# Patient Record
Sex: Female | Born: 1960 | Race: Asian | Hispanic: No | Marital: Married | Smoking: Never smoker
Health system: Southern US, Community
[De-identification: ages and names within clinical notes are randomized; demographics above are authoritative.]

## PROBLEM LIST (undated history)

## (undated) DIAGNOSIS — I4891 Unspecified atrial fibrillation: Secondary | ICD-10-CM

## (undated) DIAGNOSIS — K649 Unspecified hemorrhoids: Secondary | ICD-10-CM

## (undated) DIAGNOSIS — H43819 Vitreous degeneration, unspecified eye: Secondary | ICD-10-CM

## (undated) DIAGNOSIS — A879 Viral meningitis, unspecified: Secondary | ICD-10-CM

## (undated) DIAGNOSIS — K921 Melena: Secondary | ICD-10-CM

## (undated) DIAGNOSIS — R55 Syncope and collapse: Secondary | ICD-10-CM

## (undated) DIAGNOSIS — J309 Allergic rhinitis, unspecified: Secondary | ICD-10-CM

## (undated) DIAGNOSIS — I499 Cardiac arrhythmia, unspecified: Secondary | ICD-10-CM

## (undated) DIAGNOSIS — D259 Leiomyoma of uterus, unspecified: Secondary | ICD-10-CM

## (undated) DIAGNOSIS — B009 Herpesviral infection, unspecified: Secondary | ICD-10-CM

## (undated) DIAGNOSIS — F909 Attention-deficit hyperactivity disorder, unspecified type: Secondary | ICD-10-CM

## (undated) DIAGNOSIS — K625 Hemorrhage of anus and rectum: Secondary | ICD-10-CM

## (undated) DIAGNOSIS — R002 Palpitations: Secondary | ICD-10-CM

## (undated) DIAGNOSIS — N920 Excessive and frequent menstruation with regular cycle: Secondary | ICD-10-CM

## (undated) HISTORY — DX: Cardiac arrhythmia, unspecified: I49.9

## (undated) HISTORY — DX: Melena: K92.1

## (undated) HISTORY — DX: Leiomyoma of uterus, unspecified: D25.9

## (undated) HISTORY — DX: Unspecified hemorrhoids: K64.9

## (undated) HISTORY — DX: Palpitations: R00.2

## (undated) HISTORY — DX: Allergic rhinitis, unspecified: J30.9

## (undated) HISTORY — DX: Attention-deficit hyperactivity disorder, unspecified type: F90.9

## (undated) HISTORY — PX: COLONOSCOPY: SHX174

## (undated) HISTORY — DX: Excessive and frequent menstruation with regular cycle: N92.0

## (undated) HISTORY — DX: Viral meningitis, unspecified: A87.9

## (undated) HISTORY — DX: Hemorrhage of anus and rectum: K62.5

## (undated) HISTORY — DX: Vitreous degeneration, unspecified eye: H43.819

## (undated) HISTORY — DX: Herpesviral infection, unspecified: B00.9

---

## 2003-07-26 HISTORY — PX: TOTAL ABDOMINAL HYSTERECTOMY: SHX209

## 2004-08-12 ENCOUNTER — Other Ambulatory Visit: Admission: RE | Admit: 2004-08-12 | Discharge: 2004-08-12 | Payer: Self-pay | Admitting: Obstetrics and Gynecology

## 2004-10-07 ENCOUNTER — Encounter: Admission: RE | Admit: 2004-10-07 | Discharge: 2004-10-07 | Payer: Self-pay | Admitting: Obstetrics and Gynecology

## 2004-10-22 ENCOUNTER — Encounter: Admission: RE | Admit: 2004-10-22 | Discharge: 2004-10-22 | Payer: Self-pay | Admitting: Obstetrics and Gynecology

## 2005-06-03 ENCOUNTER — Emergency Department (HOSPITAL_COMMUNITY): Admission: EM | Admit: 2005-06-03 | Discharge: 2005-06-04 | Payer: Self-pay | Admitting: Emergency Medicine

## 2006-05-03 ENCOUNTER — Other Ambulatory Visit: Admission: RE | Admit: 2006-05-03 | Discharge: 2006-05-03 | Payer: Self-pay | Admitting: Internal Medicine

## 2006-11-16 IMAGING — CR DG ANKLE COMPLETE 3+V*R*
3 series · 3 of 3 positions shown · non-contrast
Comparison: none

CLINICAL DATA: Fell.
RIGHT ANKLE - 3 VIEW:

[view not recorded (1 of 3)]
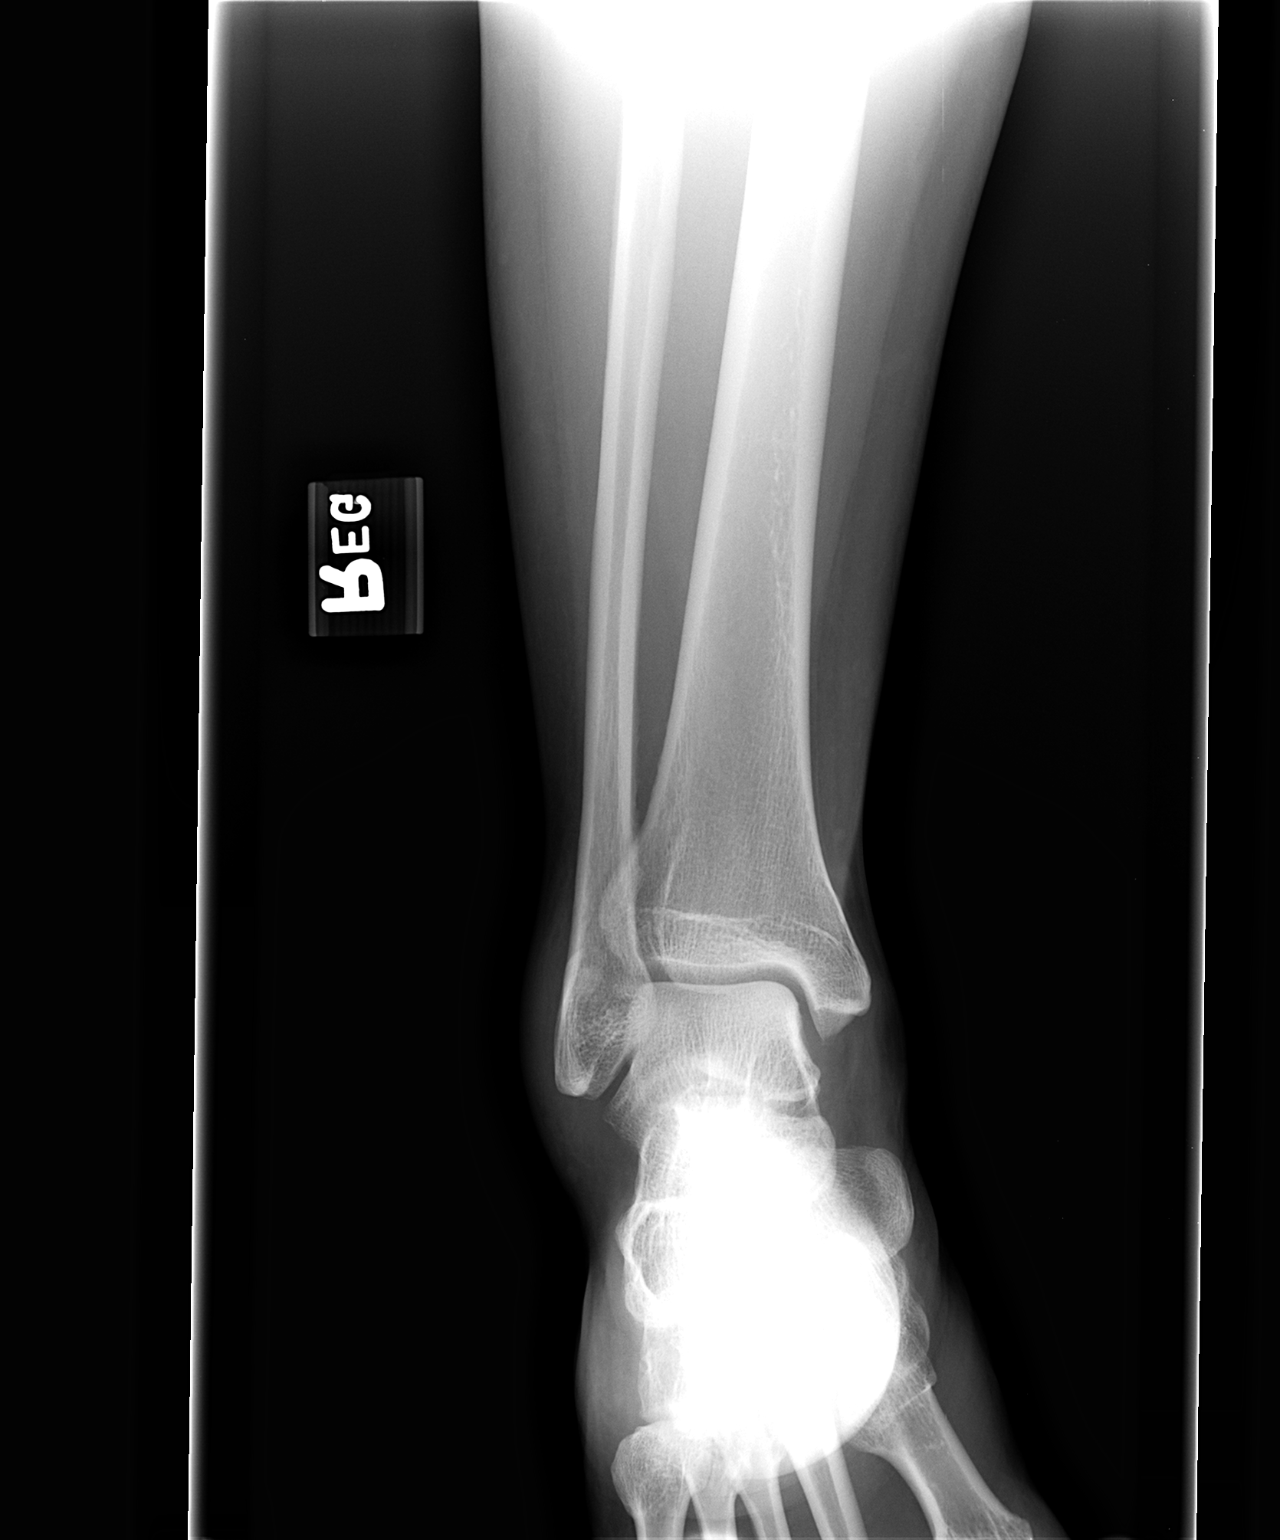

[view not recorded (2 of 3)]
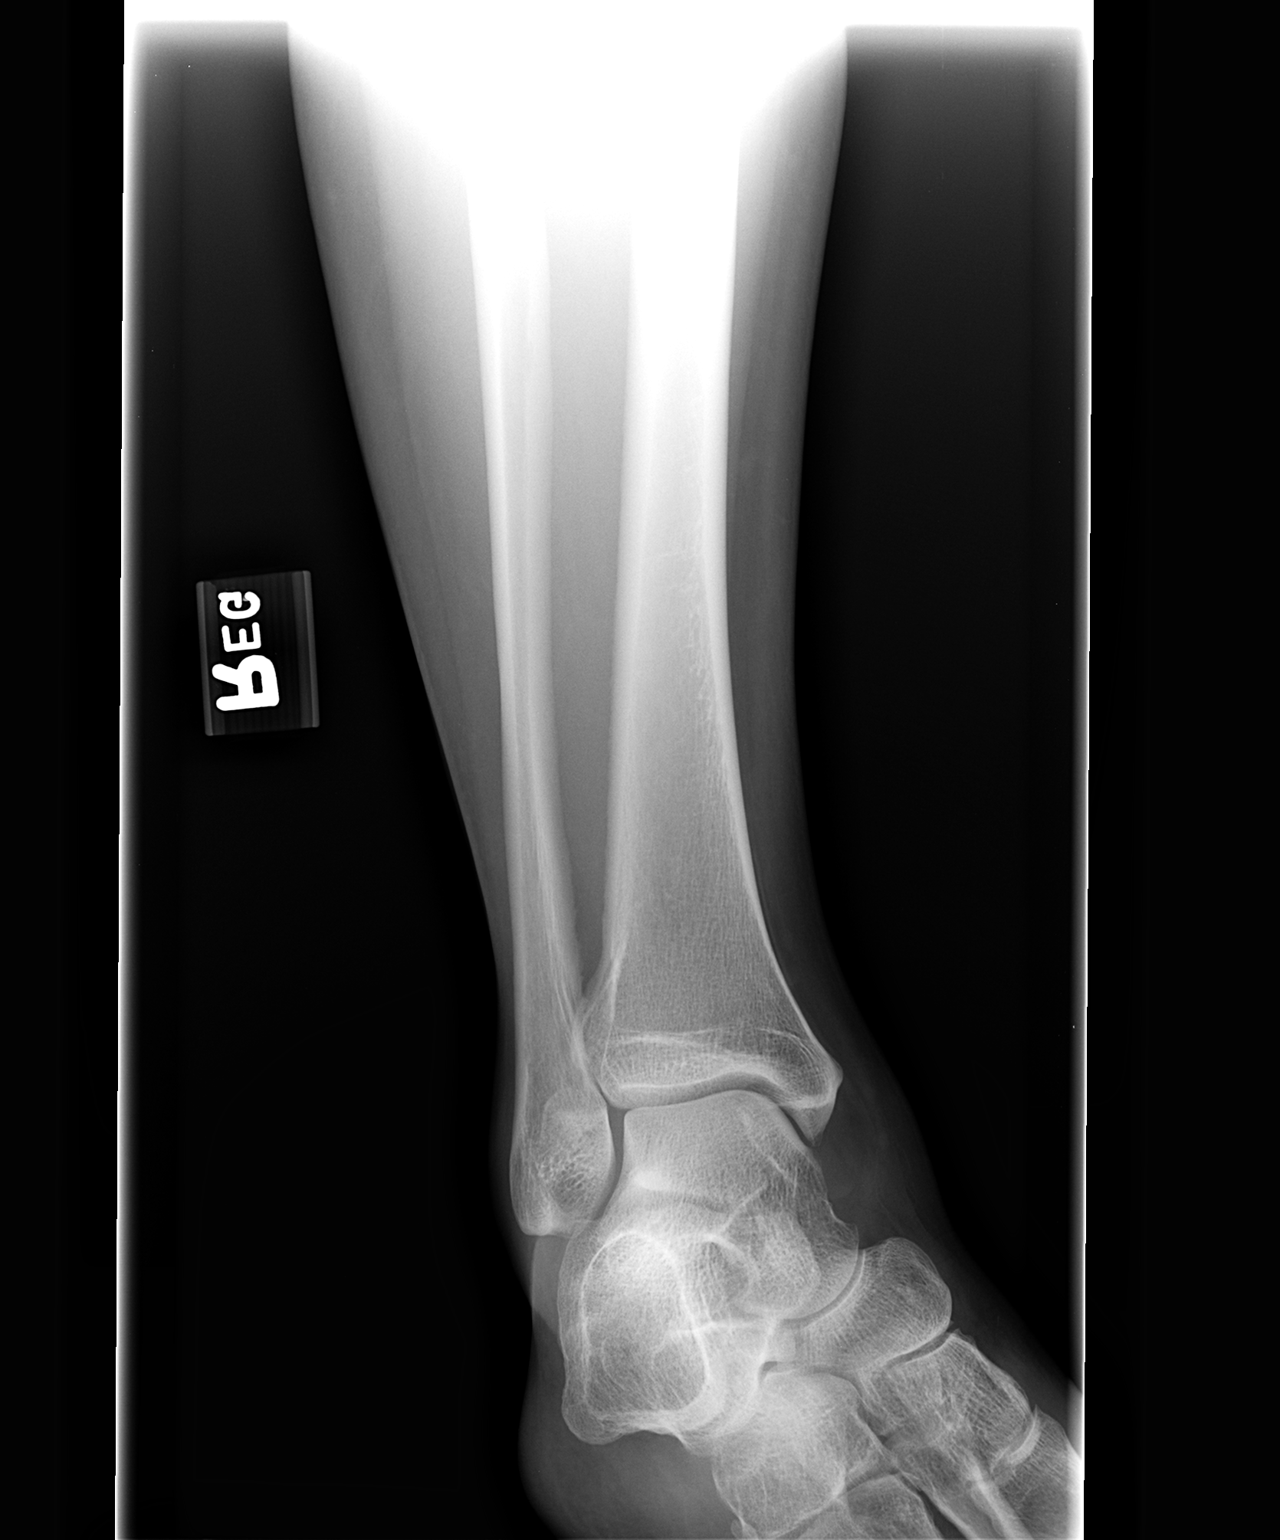

[view not recorded (3 of 3)]
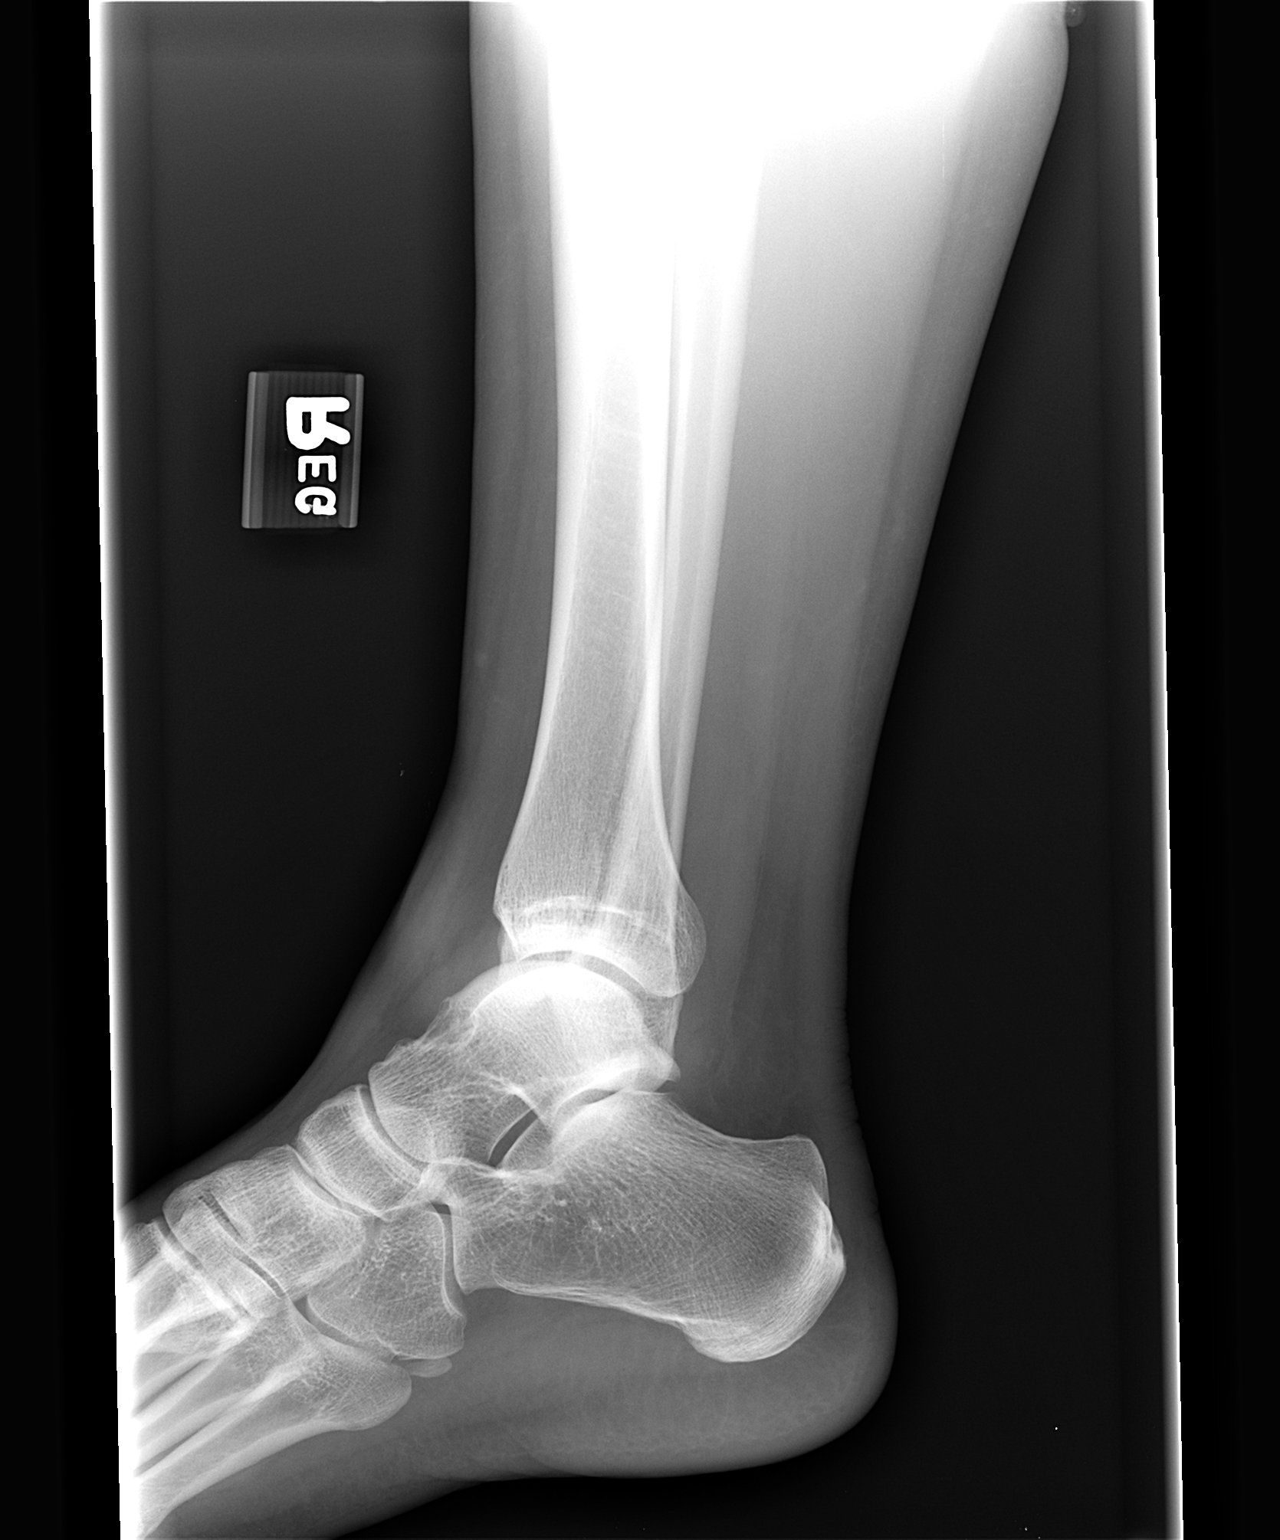

[3 of 3 positions shown; findings below may reference images not displayed]

FINDINGS: Soft tissue swelling laterally.  No fracture or dislocation.  No radiopaque foreign body detected.
IMPRESSION: Soft tissue swelling without underlying fracture.

## 2007-05-10 ENCOUNTER — Other Ambulatory Visit: Admission: RE | Admit: 2007-05-10 | Discharge: 2007-05-10 | Payer: Self-pay | Admitting: *Deleted

## 2007-05-22 ENCOUNTER — Other Ambulatory Visit: Admission: RE | Admit: 2007-05-22 | Discharge: 2007-05-22 | Payer: Self-pay | Admitting: *Deleted

## 2011-06-02 ENCOUNTER — Other Ambulatory Visit (HOSPITAL_COMMUNITY)
Admission: RE | Admit: 2011-06-02 | Discharge: 2011-06-02 | Disposition: A | Discharge status: Home / Self Care | Payer: BC Managed Care – PPO | Source: Ambulatory Visit | Attending: Family Medicine | Admitting: Family Medicine

## 2011-06-02 ENCOUNTER — Other Ambulatory Visit: Payer: Self-pay

## 2011-06-02 DIAGNOSIS — Z01419 Encounter for gynecological examination (general) (routine) without abnormal findings: Secondary | ICD-10-CM | POA: Insufficient documentation

## 2013-05-08 ENCOUNTER — Emergency Department (HOSPITAL_COMMUNITY)
Admission: EM | Admit: 2013-05-08 | Discharge: 2013-05-08 | Disposition: A | Discharge status: Home / Self Care | Payer: BC Managed Care – PPO | Attending: Emergency Medicine | Admitting: Emergency Medicine

## 2013-05-08 ENCOUNTER — Encounter (HOSPITAL_COMMUNITY): Payer: Self-pay | Admitting: Emergency Medicine

## 2013-05-08 DIAGNOSIS — Z79899 Other long term (current) drug therapy: Secondary | ICD-10-CM | POA: Insufficient documentation

## 2013-05-08 DIAGNOSIS — Z880 Allergy status to penicillin: Secondary | ICD-10-CM | POA: Insufficient documentation

## 2013-05-08 DIAGNOSIS — F411 Generalized anxiety disorder: Secondary | ICD-10-CM | POA: Insufficient documentation

## 2013-05-08 DIAGNOSIS — R002 Palpitations: Secondary | ICD-10-CM | POA: Insufficient documentation

## 2013-05-08 HISTORY — DX: Syncope and collapse: R55

## 2013-05-08 LAB — CBC WITH DIFFERENTIAL/PLATELET
Basophils Absolute: 0 10*3/uL (ref 0.0–0.1)
Basophils Relative: 0 % (ref 0–1)
Eosinophils Absolute: 0.1 10*3/uL (ref 0.0–0.7)
Eosinophils Relative: 1 % (ref 0–5)
HCT: 39.6 % (ref 36.0–46.0)
Hemoglobin: 13.7 g/dL (ref 12.0–15.0)
Lymphocytes Relative: 32 % (ref 12–46)
Lymphs Abs: 2.6 10*3/uL (ref 0.7–4.0)
MCH: 29.5 pg (ref 26.0–34.0)
MCHC: 34.6 g/dL (ref 30.0–36.0)
MCV: 85.2 fL (ref 78.0–100.0)
Monocytes Absolute: 0.5 10*3/uL (ref 0.1–1.0)
Monocytes Relative: 6 % (ref 3–12)
Neutro Abs: 4.9 10*3/uL (ref 1.7–7.7)
Neutrophils Relative %: 61 % (ref 43–77)
Platelets: 211 10*3/uL (ref 150–400)
RBC: 4.65 MIL/uL (ref 3.87–5.11)
RDW: 13.7 % (ref 11.5–15.5)
WBC: 8.1 10*3/uL (ref 4.0–10.5)

## 2013-05-08 LAB — BASIC METABOLIC PANEL
BUN: 14 mg/dL (ref 6–23)
CO2: 20 mEq/L (ref 19–32)
Calcium: 8.8 mg/dL (ref 8.4–10.5)
Chloride: 105 mEq/L (ref 96–112)
Creatinine, Ser: 0.62 mg/dL (ref 0.50–1.10)
GFR calc Af Amer: >90 mL/min (ref >90)
GFR calc non Af Amer: >90 mL/min (ref >90)
Glucose, Bld: 97 mg/dL (ref 70–99)
Potassium: 4 mEq/L (ref 3.5–5.1)
Sodium: 137 mEq/L (ref 135–145)

## 2013-05-08 LAB — POCT I-STAT TROPONIN I: Troponin i, poc: 0 ng/mL (ref 0.00–0.08)

## 2013-05-08 NOTE — ED Notes (Signed)
Pt developed left central CP around 2pm this afternoon. Went to her PCP and office called EMS to bring to the ER for further evaluation. Pt received 2 nitro tabs SL and brought chest discomfort from a 3 to 0.5.

## 2013-05-09 NOTE — ED Provider Notes (Signed)
CSN: [id number]     Arrival date & time 05/08/13  1745 History   First MD Initiated Contact with Patient 05/08/13 1816     Chief Complaint  Patient presents with  . Chest Pain    HPI  Patient presents with a several year history of a feeling of "a funny feeling in my chest". She describes these as a feeling of a flutter o'clock in her chest followed by a feeling of rush in her neck. She'll fill his a few fast heartbeats for a few seconds afterwards no longer than 5-10 seconds. She felt a little discomfort today and was seen by her physician referred here.  One or 2 cups of caffeine per day no other stimulants with cough and cold medicine. No history hypertension diabetes high cholesterol or smoking. No family history of heart disease.  Past Medical History  Diagnosis Date  . Syncope    History reviewed. No pertinent past surgical history. No family history on file. History  Substance Use Topics  . Smoking status: Not on file  . Smokeless tobacco: Not on file  . Alcohol Use: Not on file   OB History   Grav Para Term Preterm Abortions TAB SAB Ect Mult Living                 Review of Systems  Constitutional: Negative for fever, chills, diaphoresis, appetite change and fatigue.  HENT: Negative for mouth sores, sore throat and trouble swallowing.   Eyes: Negative for visual disturbance.  Respiratory: Negative for cough, chest tightness, shortness of breath and wheezing.   Cardiovascular: Negative for chest pain.  Gastrointestinal: Negative for nausea, vomiting, abdominal pain, diarrhea and abdominal distention.  Endocrine: Negative for polydipsia, polyphagia and polyuria.  Genitourinary: Negative for dysuria, frequency and hematuria.  Musculoskeletal: Negative for gait problem.  Skin: Negative for color change, pallor and rash.  Neurological: Negative for dizziness, syncope, light-headedness and headaches.  Hematological: Does not bruise/bleed easily.  Psychiatric/Behavioral:  Negative for behavioral problems and confusion.       Anxious speaks with pressured speech that dominates a conversation    Allergies  Apple and Penicillins  Home Medications   Current Outpatient Rx  Name  Route  Sig  Dispense  Refill  . aspirin 81 MG tablet   Oral   Take 81 mg by mouth daily as needed for pain.         . Cholecalciferol (VITAMIN D-3) 1000 UNITS CAPS   Oral   Take 1,000 Units by mouth daily.          BP 103/80  Pulse 65  Temp(Src) 98 F (36.7 C) (Oral)  Resp 19  Ht 5\' 5"  (1.651 m)  Wt 135 lb (61.236 kg)  BMI 22.47 kg/m2  SpO2 99% Physical Exam  Constitutional: She is oriented to person, place, and time. She appears well-developed and well-nourished. No distress.  HENT:  Head: Normocephalic.  Eyes: Conjunctivae are normal. Pupils are equal, round, and reactive to light. No scleral icterus.  Neck: Normal range of motion. Neck supple. No thyromegaly present.  Cardiovascular: Normal rate and regular rhythm.  Exam reveals no gallop and no friction rub.   No murmur heard. Sinus rhythm on the monitor no ectopy while in the department. No episodes of tachycardia  Pulmonary/Chest: Effort normal and breath sounds normal. No respiratory distress. She has no wheezes. She has no rales.  Abdominal: Soft. Bowel sounds are normal. She exhibits no distension. There is no tenderness. There is no  rebound.  Musculoskeletal: Normal range of motion.  Neurological: She is alert and oriented to person, place, and time.  Skin: Skin is warm and dry. No rash noted.  Psychiatric: She has a normal mood and affect. Her behavior is normal.    ED Course  Procedures (including critical care time) Labs Review Labs Reviewed  CBC WITH DIFFERENTIAL  BASIC METABOLIC PANEL  TSH  POCT I-STAT TROPONIN I   Imaging Review No results found.  EKG Interpretation   None       MDM   1. Palpitations    Her description of these events L. quite classically like PVCs. She  describes a feeling of movement in her chest sometimes a "rush" to her neck and then 5-10 seconds of tachycardia palpitations. These are isolated. She does not describe any ongoing symptoms or salvos of palpitations. She is otherwise asymptomatic with them i.e. no syncope presyncope or other symptoms.  She had a vague description of discomfort today. She has a normal troponin after several hours of this discomfort. She is normal EKG. She has normal electrolytes. TSH is pending. I have referred her to cardiology for request of Holter or event monitoring. ER if acute changes    Roney Marion, MD 05/09/13 [phone]

## 2013-05-30 ENCOUNTER — Encounter: Payer: Self-pay | Admitting: Cardiology

## 2013-06-01 ENCOUNTER — Encounter: Payer: Self-pay | Admitting: Cardiology

## 2013-06-03 ENCOUNTER — Encounter: Payer: Self-pay | Admitting: Cardiology

## 2013-06-03 ENCOUNTER — Ambulatory Visit (INDEPENDENT_AMBULATORY_CARE_PROVIDER_SITE_OTHER): Payer: BC Managed Care – PPO | Admitting: Cardiology

## 2013-06-03 ENCOUNTER — Ambulatory Visit: Payer: BC Managed Care – PPO | Admitting: Cardiology

## 2013-06-03 VITALS — BP 114/81 | HR 73 | Ht 65.0 in | Wt 136.0 lb

## 2013-06-03 DIAGNOSIS — R002 Palpitations: Secondary | ICD-10-CM | POA: Insufficient documentation

## 2013-06-03 NOTE — Progress Notes (Signed)
[address] [place], [place]  [address] Phone: [phone] Fax:  [phone]  Date:  06/03/2013   ID:  Shelly Mahoney, DOB 01/15/1961, MRN [id number]  PCP:  Cain Saupe, MD  Cardiologist:  Armanda Magic, MD     History of Present Illness: Shelly Mahoney is a 52 y.o. female with a history of uterine fibroids and severe menorrhagi about 10 years ago and was told that she had a heart murmur from her anemia.  She had a hysterectomy which resolved her anemia.  She has noticed over the past 10 years that she has occasoinal palpitations that she notices only when sitting down or lying in bed.  She describes it as a fluttering sensation that lasts a few seconds and resolves.  She says that last April she got very nauseated and had been up for 2 days with a sick child and was walking down the stairs and got nauseated and dizzy and the room got dark and she passed out.  She broke 2 ribs.  In October she went the to [facility] clinic and was told her EKG was mildly abnormal and was sent to the ER for heart fluttering and a fullness in her ear.  She was taken by EMS and was given NTG but had no CP.  Since then she continues to notice occasional heart flutters for 2-3 minutes.  She has never passed out in the setting of palpitations.  She denies any chest pain, SOB or DOE.   Wt Readings from Last 3 Encounters:  06/03/13 136 lb (61.689 kg)  05/08/13 135 lb (61.236 kg)     Past Medical History  Diagnosis Date  . Syncope   . Arrhythmia   . Palpitations   . Fibroid, uterine   . Allergic rhinitis   . Viral meningitis   . Posterior vitreous detachment   . Menorrhagia     Current Outpatient Prescriptions  Medication Sig Dispense Refill  . aspirin 81 MG tablet Take 81 mg by mouth daily as needed for pain.      . Cholecalciferol (VITAMIN D-3) 1000 UNITS CAPS Take 1,000 Units by mouth daily.       No current facility-administered medications for this visit.    Allergies:    Allergies    Allergen Reactions  . Apple Nausea Only    Red skin apples Reaction: headaches, nausea  . Cephalosporins   . Penicillins Hives and Rash    Social History:  The patient  reports that she has never smoked. She does not have any smokeless tobacco history on file. She reports that she does not drink alcohol or use illicit drugs.   Family History:  The patient's family history includes Heart attack in her father; Heart disease in her father; Hypertension in her mother.   ROS:  Please see the history of present illness.      All other systems reviewed and negative.   PHYSICAL EXAM: VS:  BP 114/81  Pulse 73  Ht 5\' 5"  (1.651 m)  Wt 136 lb (61.689 kg)  BMI 22.63 kg/m2 Well nourished, well developed, in no acute distress HEENT: normal Neck: no JVD Cardiac:  normal S1, S2; RRR; no murmur Lungs:  clear to auscultation bilaterally, no wheezing, rhonchi or rales Abd: soft, nontender, no hepatomegaly Ext: no edema Skin: warm and dry Neuro:  CNs 2-12 intact, no focal abnormalities noted     EKG:  NSR  ASSESSMENT AND PLAN:  1. Palpitations  - I  will order an Event Monitor  Followup with me in 4 weeks  Signed, Armanda Magic, MD 06/03/2013 8:41 AM

## 2013-06-03 NOTE — Patient Instructions (Addendum)
Your physician recommends that you continue on your current medications as directed. Please refer to the Current Medication list given to you today.  Your physician has recommended that you wear an event monitor. Event monitors are medical devices that record the heart's electrical activity. Doctors most often us these monitors to diagnose arrhythmias. Arrhythmias are problems with the speed or rhythm of the heartbeat. The monitor is a small, portable device. You can wear one while you do your normal daily activities. This is usually used to diagnose what is causing palpitations/syncope (passing out).   Your physician recommends that you schedule a follow-up appointment in: 4 weeks with Dr. Turner  

## 2013-06-06 ENCOUNTER — Encounter: Payer: Self-pay | Admitting: *Deleted

## 2013-06-06 ENCOUNTER — Encounter (INDEPENDENT_AMBULATORY_CARE_PROVIDER_SITE_OTHER): Payer: BC Managed Care – PPO

## 2013-06-06 DIAGNOSIS — R55 Syncope and collapse: Secondary | ICD-10-CM

## 2013-06-06 DIAGNOSIS — R002 Palpitations: Secondary | ICD-10-CM

## 2013-06-06 NOTE — Progress Notes (Signed)
Patient ID: Shelly Mahoney, female   DOB: [birth year], 52 y.o.   MRN: [id number] [employer] 30 day cardiac event monitor applied to patient.

## 2013-07-10 ENCOUNTER — Ambulatory Visit: Payer: BC Managed Care – PPO | Admitting: Cardiology

## 2013-07-16 ENCOUNTER — Encounter: Payer: Self-pay | Admitting: Cardiology

## 2013-07-16 ENCOUNTER — Ambulatory Visit (INDEPENDENT_AMBULATORY_CARE_PROVIDER_SITE_OTHER): Payer: BC Managed Care – PPO | Admitting: Cardiology

## 2013-07-16 VITALS — BP 125/78 | HR 72 | Ht 65.0 in | Wt 136.0 lb

## 2013-07-16 DIAGNOSIS — R002 Palpitations: Secondary | ICD-10-CM

## 2013-07-16 LAB — MAGNESIUM: Magnesium: 1.9 mg/dL (ref 1.5–2.5)

## 2013-07-16 NOTE — Patient Instructions (Signed)
Your physician recommends that you continue on your current medications as directed. Please refer to the Current Medication list given to you today.  Your physician recommends that you go to the lab today for a Magnesium Panel  Your physician recommends that you schedule a follow-up appointment in: as needed

## 2013-07-16 NOTE — Progress Notes (Signed)
[address] [place], [place]  [address] Phone: [phone] Fax:  [phone]  Date:  07/16/2013   ID:  Shelly Mahoney, DOB [birth year], MRN [id number]  PCP:  Cain Saupe, MD  Cardiologist:  Armanda Magic, MD     History of Present Illness: Shelly Mahoney is a 52 y.o. female with a history of uterine fibroids and severe menorrhagia about 10 years ago and was told that she had a heart murmur from her anemia. She had a hysterectomy which resolved her anemia. She noticed over the past 10 years that she has occasoinal palpitations that she notices only when sitting down or lying in bed. She describes it as a fluttering sensation that lasts a few seconds and resolves. She says that last April she got very nauseated and had been up for 2 days with a sick child and was walking down the stairs and got nauseated and dizzy and the room got dark and she passed out. She broke 2 ribs. In October she went the to [facility] clinic and was told her EKG was mildly abnormal and was sent to the ER for heart fluttering and a fullness in her ear. She was taken by EMS and was given NTG but had no CP. Since then she continues to notice occasional heart flutters for 2-3 minutes. She has never passed out in the setting of palpitations. She denies any chest pain, SOB or DOE.       Wt Readings from Last 3 Encounters:  06/03/13 136 lb (61.689 kg)  05/08/13 135 lb (61.236 kg)     Past Medical History  Diagnosis Date  . Syncope   . Arrhythmia   . Palpitations   . Fibroid, uterine   . Allergic rhinitis   . Viral meningitis   . Posterior vitreous detachment   . Menorrhagia     Current Outpatient Prescriptions  Medication Sig Dispense Refill  . aspirin 81 MG tablet Take 81 mg by mouth daily as needed for pain.      . Cholecalciferol (VITAMIN D-3) 1000 UNITS CAPS Take 2,000 Units by mouth daily.        No current facility-administered medications for this visit.    Allergies:    Allergies    Allergen Reactions  . Apple Nausea Only    Red skin apples Reaction: headaches, nausea  . Cephalosporins   . Penicillins Hives and Rash    Social History:  The patient  reports that she has never smoked. She does not have any smokeless tobacco history on file. She reports that she does not drink alcohol or use illicit drugs.   Family History:  The patient's family history includes Heart attack in her father; Heart disease in her father; Hypertension in her mother.   ROS:  Please see the history of present illness.      All other systems reviewed and negative.   PHYSICAL EXAM: VS:  There were no vitals taken for this visit. Well nourished, well developed, in no acute distress HEENT: normal Neck: no JVD Cardiac:  normal S1, S2; RRR; no murmur Lungs:  clear to auscultation bilaterally, no wheezing, rhonchi or rales Abd: soft, nontender, no hepatomegaly Ext: no edema Skin: warm and dry Neuro:  CNs 2-12 intact, no focal abnormalities noted  ASSESSMENT AND PLAN:  1. Palpitations - I have reassured her that her heart monitor showed PAC's which are benign.  She had a full panel of labs recently which showed normal TSH and potassium.  She is concerned that her magnesium level may be low so we will check a magnesium level.  I encouraged her to cut back on her caffeine intake.  Followup with me on a PRN basis    Signed, Armanda Magic, MD 07/16/2013 10:42 AM

## 2017-06-19 ENCOUNTER — Telehealth: Payer: Self-pay

## 2017-06-19 NOTE — Telephone Encounter (Signed)
Sent notes to scheduling 

## 2017-10-31 ENCOUNTER — Encounter: Payer: Self-pay | Admitting: Interventional Cardiology

## 2017-11-01 ENCOUNTER — Ambulatory Visit: Payer: BC Managed Care – PPO | Admitting: Interventional Cardiology

## 2017-11-01 ENCOUNTER — Encounter: Payer: Self-pay | Admitting: Interventional Cardiology

## 2017-11-01 VITALS — BP 112/72 | HR 62 | Ht 65.0 in | Wt 134.4 lb

## 2017-11-01 DIAGNOSIS — R002 Palpitations: Secondary | ICD-10-CM

## 2017-11-01 NOTE — Progress Notes (Signed)
Cardiology Office Note   Date:  11/01/2017   ID:  Shelly Mahoney, DOB 04-10-1961, MRN [id number]  PCP:  Lujean Amel, MD   Primary cardiologist: Dr. Fransico Him    No chief complaint on file.  palpitations  Wt Readings from Last 3 Encounters:  07/16/13 136 lb (61.7 kg)  06/03/13 136 lb (61.7 kg)  05/08/13 135 lb (61.2 kg)       History of Present Illness: Shelly Mahoney is a 57 y.o. female  Who has had palpitations in the past.  She had a Holter monitor in 2014 and this was benign per her report.    Records reviewed showed: "history of uterine fibroids and severe menorrhagia about 10 years ago and was told that she had a heart murmur from her anemia. She had a hysterectomy which resolved her anemia.."  She had a syncopal episode in 2014.  She got busy and did not follow up.  She is here today for f/u from 2014.  She does not exercise regularly at this time.    Denies : Chest pain. Dizziness. Leg edema. Nitroglycerin use. Orthopnea. Palpitations. Paroxysmal nocturnal dyspnea. Shortness of breath. Syncope.      Past Medical History:  Diagnosis Date  . 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 Allergic rhinitis   . Arrhythmia   . Blood in stool   . Fibroid, uterine   . Hemorrhoids   . Herpes   . Menorrhagia   . Palpitations   . Posterior vitreous detachment   . Rectal bleeding   . Syncope   . Viral meningitis     Past Surgical History:  Procedure Laterality Date  . CESAREAN SECTION  1991  . COLONOSCOPY    . TOTAL ABDOMINAL HYSTERECTOMY  2005     Current Outpatient Medications  Medication Sig Dispense Refill  . aspirin 81 MG tablet Take 81 mg by mouth daily as needed for pain.    . Cholecalciferol (VITAMIN D-3) 1000 UNITS CAPS Take 2,000 Units by mouth daily.      No current facility-administered medications for this visit.     Allergies:   Apple; Cephalosporins; Other; and Penicillins    Social History:  The patient  reports that she has  never smoked. She has never used smokeless tobacco. She reports that she drinks alcohol. She reports that she does not use drugs.   Family History:  The patient's family history includes Arthritis in her mother; Colon polyps (age of onset: 24) in her father; Hypertension in her mother; Osteoporosis in her mother; Other in her father and mother; Stroke in her mother.    ROS:  Please see the history of present illness.   Otherwise, review of systems are positive for occasional flutters in her chest- less pronounced recently.   All other systems are reviewed and negative.    PHYSICAL EXAM: VS:  Ht 5\' 5"  (1.651 m)   BMI 22.63 kg/m  , BMI Body mass index is 22.63 kg/m. GEN: Well nourished, well developed, in no acute distress  HEENT: normal  Neck: no JVD, carotid bruits, or masses Cardiac: RRR; no murmurs, rubs, or gallops,no edema  Respiratory:  clear to auscultation bilaterally, normal work of breathing GI: soft, nontender, nondistended, + BS MS: no deformity or atrophy  Skin: warm and dry, no rash Neuro:  Strength and sensation are intact Psych: euthymic mood, full affect   EKG:   The ekg ordered today demonstrates normal ECG   Recent Labs:  No results found for requested labs within last 8760 hours.   Lipid Panel No results found for: CHOL, TRIG, HDL, CHOLHDL, VLDL, LDLCALC, LDLDIRECT   Other studies Reviewed: Additional studies/ records that were reviewed today with results demonstrating: 2017 normal ECG.   ASSESSMENT AND PLAN:  1. Palpitations: Symptoms are less pronounced at this time.  Continue current management.  She still has occasional flutters that sound like isolated premature beats.  No lightheadedness or syncope. 2. She would benefit from regular exercise.  I encouraged her to walk to achieve the exercise level noted below.  I also encouraged a healthy diet.   Current medicines are reviewed at length with the patient today.  The patient concerns regarding her  medicines were addressed.  The following changes have been made:  No change  Labs/ tests ordered today include:  No orders of the defined types were placed in this encounter.   Recommend 150 minutes/week of aerobic exercise Low fat, low carb, high fiber diet recommended  Disposition:   FU as needed with Dr. Radford Pax   Signed, Shelly Grooms, MD  11/01/2017 11:26 AM    [address], [place], [place]  [address] Phone: [phone]; Fax: [phone]

## 2017-11-01 NOTE — Patient Instructions (Signed)
Medication Instructions:  Your physician recommends that you continue on your current medications as directed. Please refer to the Current Medication list given to you today.   Labwork: None ordered  Testing/Procedures: None ordered  Follow-Up: Your physician wants you to follow-up with Dr. Varanasi AS NEEDED   Any Other Special Instructions Will Be Listed Below (If Applicable).     If you need a refill on your cardiac medications before your next appointment, please call your pharmacy.   

## 2018-05-07 NOTE — Progress Notes (Signed)
Cardiology Office Note   Date:  05/08/2018   ID:  Shelly Mahoney, DOB June 25, 1961, MRN [id number]  PCP:  Lujean Amel, MD    No chief complaint on file.  palpitations  Wt Readings from Last 3 Encounters:  05/08/18 129 lb 9.6 oz (58.8 kg)  11/01/17 134 lb 6.4 oz (61 kg)  07/16/13 136 lb (61.7 kg)       History of Present Illness: Shelly Mahoney is a 57 y.o. female  Who has had palpitations in the past.  She had a Holter monitor in 2014 and this was benign per her report.    Records reviewed showed: "history of uterine fibroids and severe menorrhagia about 10 years ago and was told that she had a heart murmur from her anemia. She had a hysterectomy which resolved her anemia.."  She had a syncopal episode in 2014.  Seen in 2019.  Had sx of isolated premature beats.  Exercise was also recommended.   Since the last visit, she had done well until recently.  She is felt chest pain yesterday- it was sharp and occurred at rest under the left breast, between the ribs- resolved after a minute. No associated symptoms.   She has noted a headache  And stiff neck yesterday.  She feels some heart pounding- like her prior PACs..    No problems waling up stairs.  18 steps to go upstairs and no cardiac sx with multiple trips.   Past Medical History:  Diagnosis Date  . 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 Allergic rhinitis   . Arrhythmia   . Blood in stool   . Fibroid, uterine   . Hemorrhoids   . Herpes   . Menorrhagia   . Palpitations   . Posterior vitreous detachment   . Rectal bleeding   . Syncope   . Viral meningitis     Past Surgical History:  Procedure Laterality Date  . CESAREAN SECTION  1991  . COLONOSCOPY    . TOTAL ABDOMINAL HYSTERECTOMY  2005     Current Outpatient Medications  Medication Sig Dispense Refill  . aspirin 81 MG tablet Take 81 mg by mouth daily as needed for pain.    . Cholecalciferol (VITAMIN D-3) 1000 UNITS CAPS Take 1,000 Units by  mouth daily.      No current facility-administered medications for this visit.     Allergies:   Apple; Cephalosporins; Other; and Penicillins    Social History:  The patient  reports that she has never smoked. She has never used smokeless tobacco. She reports that she drinks alcohol. She reports that she does not use drugs.   Family History:  The patient's family history includes Arthritis in her mother; Colon polyps (age of onset: 76) in her father; Hypertension in her mother; Osteoporosis in her mother; Other in her father and mother; Stroke in her mother.    ROS:  Please see the history of present illness.   Otherwise, review of systems are positive for CP as above.   All other systems are reviewed and negative.    PHYSICAL EXAM: VS:  BP 100/78   Pulse 66   Ht 5\' 5"  (1.651 m)   Wt 129 lb 9.6 oz (58.8 kg)   SpO2 98%   BMI 21.57 kg/m  , BMI Body mass index is 21.57 kg/m. GEN: Well nourished, well developed, in no acute distress  HEENT: normal  Neck: no JVD, carotid bruits, or masses Cardiac: RRR; no murmurs,  rubs, or gallops,no edema  Respiratory:  clear to auscultation bilaterally, normal work of breathing GI: soft, nontender, nondistended, + BS MS: no deformity or atrophy  Skin: warm and dry, no rash Neuro:  Strength and sensation are intact Psych: euthymic mood, full affect   EKG:   The ekg ordered today demonstrates normal sinus rhythm   Recent Labs: No results found for requested labs within last 8760 hours.   Lipid Panel No results found for: CHOL, TRIG, HDL, CHOLHDL, VLDL, LDLCALC, LDLDIRECT   Other studies Reviewed: Additional studies/ records that were reviewed today with results demonstrating: monitor results noted. LDL < 130, HDL 68.   ASSESSMENT AND PLAN:  1. Chest pain: Very atypical.  Nonexertional.  We discussed further evaluation with imaging such as CT angiogram.  She is not interested at this time.  I think this is reasonable.  If her symptoms  come back, we can always reconsider.  Her episode was a an isolated, short episode that was atypical.  I certainly would encourage her to have further testing if she had any exertional symptoms.  Currently, she has no exertional discomfort. 2. PAC: She has some episodic premature beats.  None noted on exam today. 3. Headache/stiff neck: No fever or visual changes.  History of viral meningitis.  I do not think this is related to her heart.  She will monitor her symptoms and contact her primary care doctor if things get worse.   Current medicines are reviewed at length with the patient today.  The patient concerns regarding her medicines were addressed.  The following changes have been made:  No change  Labs/ tests ordered today include:  No orders of the defined types were placed in this encounter.   Recommend 150 minutes/week of aerobic exercise Low fat, low carb, high fiber diet recommended  Disposition:   FU as needed   Signed, Larae Grooms, MD  05/08/2018 2:55 PM    [address], [place], [place]  [address] Phone: [phone]; Fax: [phone]

## 2018-05-08 ENCOUNTER — Ambulatory Visit: Payer: BC Managed Care – PPO | Admitting: Interventional Cardiology

## 2018-05-08 ENCOUNTER — Encounter: Payer: Self-pay | Admitting: Interventional Cardiology

## 2018-05-08 VITALS — BP 100/78 | HR 66 | Ht 65.0 in | Wt 129.6 lb

## 2018-05-08 DIAGNOSIS — I491 Atrial premature depolarization: Secondary | ICD-10-CM | POA: Diagnosis not present

## 2018-05-08 DIAGNOSIS — R0782 Intercostal pain: Secondary | ICD-10-CM

## 2018-05-08 DIAGNOSIS — R51 Headache: Secondary | ICD-10-CM

## 2018-05-08 DIAGNOSIS — R519 Headache, unspecified: Secondary | ICD-10-CM

## 2018-05-08 NOTE — Patient Instructions (Signed)
Medication Instructions:  Your physician recommends that you continue on your current medications as directed. Please refer to the Current Medication list given to you today.  If you need a refill on your cardiac medications before your next appointment, please call your pharmacy.   Lab work: None Ordered  If you have labs (blood work) drawn today and your tests are completely normal, you will receive your results only by: . MyChart Message (if you have MyChart) OR . A paper copy in the mail If you have any lab test that is abnormal or we need to change your treatment, we will call you to review the results.  Testing/Procedures: None ordered  Follow-Up: . AS NEEDED  Any Other Special Instructions Will Be Listed Below (If Applicable).    

## 2019-10-17 ENCOUNTER — Ambulatory Visit: Payer: BC Managed Care – PPO

## 2019-10-19 ENCOUNTER — Ambulatory Visit: Payer: BC Managed Care – PPO | Attending: Internal Medicine

## 2019-10-19 DIAGNOSIS — Z23 Encounter for immunization: Secondary | ICD-10-CM

## 2019-10-19 NOTE — Progress Notes (Signed)
   Covid-19 Vaccination Clinic  Name:  Milca Mcerlean    MRN: [id number] DOB: [birth year]  10/19/2019  Ms. Strickler was observed post Covid-19 immunization for 30 minutes based on pre-vaccination screening without incident. She was provided with Vaccine Information Sheet and instruction to access the V-Safe system.   Ms. Burlingham was instructed to call 911 with any severe reactions post vaccine: Marland Kitchen Difficulty breathing  . Swelling of face and throat  . A fast heartbeat  . A bad rash all over body  . Dizziness and weakness   Immunizations Administered    Name Date Dose VIS Date Route   Pfizer COVID-19 Vaccine 10/19/2019  2:51 PM 0.3 mL 07/05/2019 Intramuscular   Manufacturer: [place]   Lot: [id number]   [place]: [id number]

## 2019-10-25 ENCOUNTER — Other Ambulatory Visit: Payer: Self-pay

## 2019-10-25 ENCOUNTER — Emergency Department (HOSPITAL_COMMUNITY)
Admission: EM | Admit: 2019-10-25 | Discharge: 2019-10-25 | Disposition: A | Discharge status: Home / Self Care | Payer: BC Managed Care – PPO | Attending: Emergency Medicine | Admitting: Emergency Medicine

## 2019-10-25 DIAGNOSIS — Z7982 Long term (current) use of aspirin: Secondary | ICD-10-CM | POA: Diagnosis not present

## 2019-10-25 DIAGNOSIS — R002 Palpitations: Secondary | ICD-10-CM | POA: Diagnosis present

## 2019-10-25 LAB — BASIC METABOLIC PANEL
Anion gap: 11 (ref 5–15)
BUN: 14 mg/dL (ref 6–20)
CO2: 23 mmol/L (ref 22–32)
Calcium: 8.7 mg/dL — ABNORMAL LOW (ref 8.9–10.3)
Chloride: 109 mmol/L (ref 98–111)
Creatinine, Ser: 0.8 mg/dL (ref 0.44–1.00)
GFR calc Af Amer: >60 mL/min (ref >60)
GFR calc non Af Amer: >60 mL/min (ref >60)
Glucose, Bld: 120 mg/dL — ABNORMAL HIGH (ref 70–99)
Potassium: 3.5 mmol/L (ref 3.5–5.1)
Sodium: 143 mmol/L (ref 135–145)

## 2019-10-25 LAB — CBC
HCT: 42.8 % (ref 36.0–46.0)
Hemoglobin: 13.9 g/dL (ref 12.0–15.0)
MCH: 28.8 pg (ref 26.0–34.0)
MCHC: 32.5 g/dL (ref 30.0–36.0)
MCV: 88.8 fL (ref 80.0–100.0)
Platelets: 255 10*3/uL (ref 150–400)
RBC: 4.82 MIL/uL (ref 3.87–5.11)
RDW: 13.9 % (ref 11.5–15.5)
WBC: 5.7 10*3/uL (ref 4.0–10.5)
nRBC: 0 % (ref 0.0–0.2)

## 2019-10-25 LAB — MAGNESIUM: Magnesium: 1.9 mg/dL (ref 1.7–2.4)

## 2019-10-25 LAB — TSH: TSH: 1.631 u[IU]/mL (ref 0.350–4.500)

## 2019-10-25 NOTE — ED Notes (Signed)
ED Provider at bedside. 

## 2019-10-25 NOTE — ED Triage Notes (Addendum)
Pt reports 3 hr PTA she began to feel heart "fluttering and then start pounding... a sensation of skipping, like a fluttering.  Activity made it worse"  Denies SHOB, denies CP.    Reports hx of PAC's. Pt reports taking 5 baby aspirin appx 1 hour PTA.    Pt reports receiving 1/2 COVID shot on 3/27.

## 2019-10-25 NOTE — Discharge Instructions (Signed)
Please read and follow all provided instructions.  Your diagnoses today include:  1. Palpitations     Tests performed today include:  An EKG of your heart - no problems  Blood counts and electrolytes  Thyroid test - came back and was normal  Vital signs. See below for your results today.   Medications prescribed:   None  Take any prescribed medications only as directed.  Follow-up instructions: Please follow-up with your cardiologist for recheck, especially if your symptoms are recurrent.  Return instructions:  SEEK IMMEDIATE MEDICAL ATTENTION IF:  You have severe chest pain, especially if the pain is crushing or pressure-like and spreads to the arms, back, neck, or jaw, or if you have sweating, nausea (feeling sick to your stomach), or shortness of breath. THIS IS AN EMERGENCY. Don't wait to see if the pain will go away. Get medical help at once. Call 911 or 0 (operator). DO NOT drive yourself to the hospital.   Your chest pain gets worse and does not go away with rest.   You have an attack of chest pain lasting longer than usual, despite rest and treatment with the medications your caregiver has prescribed.   You wake from sleep with chest pain or shortness of breath.  You feel dizzy or faint.  You have chest pain not typical of your usual pain for which you originally saw your caregiver.   You have any other emergent concerns regarding your health.  Your vital signs today were: BP 102/72   Pulse 62   Temp 97.9 F (36.6 C) (Oral)   Resp 19   SpO2 99%  If your blood pressure (BP) was elevated above 135/85 this visit, please have this repeated by your doctor within one month. --------------

## 2019-10-25 NOTE — ED Provider Notes (Signed)
[facility] DEPT Provider Note   CSN: [id number] Arrival date & time: 10/25/19  1446     History Chief Complaint  Patient presents with  . Palpitations    Shelly Mahoney is a 59 y.o. female.  Patient with history of palpitations presents the emergency department with complaint of fluttering, fast heart rate, pounding, and skipping of heart starting approximately 3 hours ago.  Patient states that she drank 2 cups of coffee this morning which is her normal.  She states she split a box of 12 doughnuts with her son just prior to symptoms occurring.  She denies any chest pain, shortness of breath, lightheadedness or syncope.  During previous episodes of palpitation she has had an episode of syncope.  She denies any swelling of her legs.  Patient was evaluated for palpitations in 2014 with a Holter monitor.  This showed any PACs.  Patient had normal lab work including thyroid studies at that time.  Patient was seen again by cardiology 2 or 3 years ago.  No recent lab work done.  She received her first Covid vaccine on 3/27 without any subsequent symptoms.  She denies fever, vomiting, diarrhea.  No family history of heart disease.  No history of hypertension, hyperlipidemia, diabetes, smoking.        Past Medical History:  Diagnosis Date  . ADHD      ATTENTIONS SPECIALIST  . Allergic rhinitis   . Arrhythmia   . Blood in stool   . Fibroid, uterine   . Hemorrhoids   . Herpes   . Menorrhagia   . Palpitations   . Posterior vitreous detachment   . Rectal bleeding   . Syncope   . Viral meningitis     Patient Active Problem List   Diagnosis Date Noted  . Heart palpitations 06/03/2013    Past Surgical History:  Procedure Laterality Date  . CESAREAN SECTION  1991  . COLONOSCOPY    . TOTAL ABDOMINAL HYSTERECTOMY  2005     OB History   No obstetric history on file.     Family History  Problem Relation Age of Onset  . Hypertension Mother    . Stroke Mother   . Arthritis Mother   . Osteoporosis Mother   . Other Mother        PRECANCEROUS ANAL LESION  . Colon polyps Father 28  . Other Father        RESISTANT H. PYLORI  . Colon cancer Neg Hx   . CAD Neg Hx   . Liver cancer Neg Hx   . Kidney cancer Neg Hx     Social History   Tobacco Use  . Smoking status: Never Smoker  . Smokeless tobacco: Never Used  Substance Use Topics  . Alcohol use: Yes  . Drug use: No    Home Medications Prior to Admission medications   Medication Sig Start Date End Date Taking? Authorizing Provider  aspirin 81 MG tablet Take 81 mg by mouth daily as needed for pain.    [provider]  Cholecalciferol (VITAMIN D-3) 1000 UNITS CAPS Take 1,000 Units by mouth daily.     [provider]    Allergies    Apple, Cephalosporins, Other, and Penicillins  Review of Systems   Review of Systems  Constitutional: Negative for diaphoresis and fever.  Eyes: Negative for redness.  Respiratory: Negative for cough and shortness of breath.   Cardiovascular: Positive for palpitations. Negative for chest pain and leg swelling.  Gastrointestinal: Negative for abdominal pain, nausea and vomiting.  Genitourinary: Negative for dysuria.  Musculoskeletal: Negative for back pain and neck pain.  Skin: Negative for rash.  Neurological: Negative for syncope and light-headedness.  Psychiatric/Behavioral: The patient is not nervous/anxious.     Physical Exam Updated Vital Signs BP 120/75 (BP Location: Left Arm)   Pulse 68   Temp 97.9 F (36.6 C) (Oral)   Resp 13   SpO2 100%   Physical Exam Vitals and nursing note reviewed.  Constitutional:      Appearance: She is well-developed. She is not diaphoretic.  HENT:     Head: Normocephalic and atraumatic.     Mouth/Throat:     Mouth: Mucous membranes are not dry.  Eyes:     Conjunctiva/sclera: Conjunctivae normal.  Neck:     Vascular: Normal carotid pulses. No carotid bruit or JVD.      Trachea: Trachea normal. No tracheal deviation.     Comments: No thyromegaly noted. Cardiovascular:     Rate and Rhythm: Normal rate and regular rhythm.     Pulses: No decreased pulses.     Heart sounds: Normal heart sounds, S1 normal and S2 normal. No murmur.  Pulmonary:     Effort: Pulmonary effort is normal. No respiratory distress.     Breath sounds: No wheezing.  Chest:     Chest wall: No tenderness.  Abdominal:     General: Bowel sounds are normal.     Palpations: Abdomen is soft.     Tenderness: There is no abdominal tenderness. There is no guarding or rebound.  Musculoskeletal:        General: Normal range of motion.     Cervical back: Normal range of motion and neck supple. No muscular tenderness.  Skin:    General: Skin is warm and dry.     Coloration: Skin is not pale.  Neurological:     Mental Status: She is alert.     ED Results / Procedures / Treatments   Labs (all labs ordered are listed, but only abnormal results are displayed) Labs Reviewed  BASIC METABOLIC PANEL - Abnormal; Notable for the following components:      Result Value   Glucose, Bld 120 (*)    Calcium 8.7 (*)    All other components within normal limits  CBC  TSH  MAGNESIUM    EKG EKG Interpretation  Date/Time:  Friday October 25 2019 15:00:26 EDT Ventricular Rate:  66 PR Interval:    QRS Duration: 74 QT Interval:  403 QTC Calculation: 423 R Axis:   65 Text Interpretation: Sinus rhythm no acute ST/T changes similar to 2014 Confirmed by Sherwood Gambler [phone]) on 10/25/2019 3:44:15 PM   Radiology No results found.  Procedures Procedures (including critical care time)  Medications Ordered in ED Medications - No data to display  ED Course  I have reviewed the triage vital signs and the nursing notes.  Pertinent labs & imaging results that were available during my care of the patient were reviewed by me and considered in my medical decision making (see chart for  details).  Patient seen and examined.  Reviewed previous work-up and cardiology notes.  Work-up initiated.  Patient is asymptomatic at this time.  Vital signs reviewed and are as follows: BP 102/72   Pulse 62   Temp 97.9 F (36.6 C) (Oral)   Resp 19   SpO2 99%   Patient has remained well without palpitations while monitoring here.  Her lab work  looks good.  We discussed all results including EKG.  Plan for discharge with cardiology follow-up.  If her symptoms are recurrent, she may need consideration of repeat Holter monitor.    Encouraged return to the emergency department with worsening symptoms, chest pain, shortness of breath, if she passes out, has any other concerns.  Encouraged avoidance of caffeine.    MDM Rules/Calculators/A&P                      Patient here today with palpitations after eating 6 donuts.  Palpitations are now resolved.  She has had a history of these sensations, however not in a couple of years.  No lightheadedness or syncope today.  Lab work-up is reassuring.  EKG without prolonged QTC, Brugada syndrome, reentrant tachycardia, heart block, signs of hypertrophy.  Patient looks well.  Feel comfortable with discharge to home at this time and having her follow-up with cardiology.     Final Clinical Impression(s) / ED Diagnoses Final diagnoses:  Palpitations    Rx / DC Orders ED Discharge Orders    None       Suann Larry 10/25/19 1837    Virgel Manifold, MD 10/25/19 Bosie Helper

## 2019-11-12 ENCOUNTER — Ambulatory Visit: Payer: BC Managed Care – PPO | Attending: Internal Medicine

## 2019-11-12 DIAGNOSIS — Z23 Encounter for immunization: Secondary | ICD-10-CM

## 2019-11-12 NOTE — Progress Notes (Signed)
   Covid-19 Vaccination Clinic  Name:  Shelly Mahoney    MRN: [id number] DOB: Mar 10, 1961  11/12/2019  Shelly Mahoney was observed post Covid-19 immunization for 15 minutes without incident. She was provided with Vaccine Information Sheet and instruction to access the V-Safe system.   Shelly Mahoney was instructed to call 911 with any severe reactions post vaccine: Marland Kitchen Difficulty breathing  . Swelling of face and throat  . A fast heartbeat  . A bad rash all over body  . Dizziness and weakness   Immunizations Administered    Name Date Dose VIS Date Route   Pfizer COVID-19 Vaccine 11/12/2019 11:51 AM 0.3 mL 09/18/2018 Intramuscular   Manufacturer: [place]   Lot: [id number]   [place]: [id number]

## 2020-03-25 DIAGNOSIS — R000 Tachycardia, unspecified: Secondary | ICD-10-CM | POA: Insufficient documentation

## 2020-06-07 ENCOUNTER — Emergency Department (HOSPITAL_COMMUNITY)
Admission: EM | Admit: 2020-06-07 | Discharge: 2020-06-08 | Disposition: A | Discharge status: Home / Self Care | Payer: BC Managed Care – PPO | Attending: Emergency Medicine | Admitting: Emergency Medicine

## 2020-06-07 ENCOUNTER — Encounter (HOSPITAL_COMMUNITY): Payer: Self-pay | Admitting: Emergency Medicine

## 2020-06-07 ENCOUNTER — Other Ambulatory Visit: Payer: Self-pay

## 2020-06-07 ENCOUNTER — Emergency Department (HOSPITAL_COMMUNITY): Payer: BC Managed Care – PPO

## 2020-06-07 DIAGNOSIS — Z7982 Long term (current) use of aspirin: Secondary | ICD-10-CM | POA: Diagnosis not present

## 2020-06-07 DIAGNOSIS — I499 Cardiac arrhythmia, unspecified: Secondary | ICD-10-CM | POA: Insufficient documentation

## 2020-06-07 DIAGNOSIS — R002 Palpitations: Secondary | ICD-10-CM | POA: Diagnosis present

## 2020-06-07 LAB — BASIC METABOLIC PANEL
Anion gap: 10 (ref 5–15)
BUN: 14 mg/dL (ref 6–20)
CO2: 22 mmol/L (ref 22–32)
Calcium: 8.9 mg/dL (ref 8.9–10.3)
Chloride: 108 mmol/L (ref 98–111)
Creatinine, Ser: 0.75 mg/dL (ref 0.44–1.00)
GFR, Estimated: >60 mL/min (ref >60)
Glucose, Bld: 126 mg/dL — ABNORMAL HIGH (ref 70–99)
Potassium: 3.5 mmol/L (ref 3.5–5.1)
Sodium: 140 mmol/L (ref 135–145)

## 2020-06-07 LAB — I-STAT BETA HCG BLOOD, ED (MC, WL, AP ONLY): I-stat hCG, quantitative: <5 m[IU]/mL (ref <5)

## 2020-06-07 LAB — CBC
HCT: 43.8 % (ref 36.0–46.0)
Hemoglobin: 14.8 g/dL (ref 12.0–15.0)
MCH: 28.7 pg (ref 26.0–34.0)
MCHC: 33.8 g/dL (ref 30.0–36.0)
MCV: 84.9 fL (ref 80.0–100.0)
Platelets: 272 10*3/uL (ref 150–400)
RBC: 5.16 MIL/uL — ABNORMAL HIGH (ref 3.87–5.11)
RDW: 13.5 % (ref 11.5–15.5)
WBC: 9.6 10*3/uL (ref 4.0–10.5)
nRBC: 0 % (ref 0.0–0.2)

## 2020-06-07 LAB — TROPONIN I (HIGH SENSITIVITY): Troponin I (High Sensitivity): <2 ng/L (ref <18)

## 2020-06-07 NOTE — ED Provider Notes (Signed)
[facility] DEPT Provider Note: Georgena Spurling, MD, FACEP  CSN: [id number] MRN: [id number] ARRIVAL: 06/07/20 at 2157 ROOM: WA12/WA12   CHIEF COMPLAINT  Palpitations   HISTORY OF PRESENT ILLNESS  06/07/20 10:54 PM Shelly Mahoney is a 59 y.o. female with a history of PACs.  After eating dinner she was lying down and felt a burning sensation in her epigastrium which she characterizes as heartburn.  This was very brief and resolved after few minutes.  About 9 PM she had the sudden onset of palpitations.  She characterizes the palpitations as not just a rapid heartbeat but an irregular or chaotic heartbeat.  She denies shortness of breath, nausea or diaphoresis.  She is not currently having any chest pain.  Nothing seems to make the symptoms better or worse.  She has had similar episodes in the past which only seem to occur when she is at rest.  She states she has a history of low blood pressure.    Past Medical History:  Diagnosis Date  . ADHD    [place]  ATTENTIONS SPECIALIST  . Allergic rhinitis   . Arrhythmia   . Blood in stool   . Fibroid, uterine   . Hemorrhoids   . Herpes   . Menorrhagia   . Palpitations   . Posterior vitreous detachment   . Rectal bleeding   . Syncope   . Viral meningitis     Past Surgical History:  Procedure Laterality Date  . CESAREAN SECTION  1991  . COLONOSCOPY    . TOTAL ABDOMINAL HYSTERECTOMY  2005    Family History  Problem Relation Age of Onset  . Hypertension Mother   . Stroke Mother   . Arthritis Mother   . Osteoporosis Mother   . Other Mother        PRECANCEROUS ANAL LESION  . Colon polyps Father 20  . Other Father        RESISTANT H. PYLORI  . Colon cancer Neg Hx   . CAD Neg Hx   . Liver cancer Neg Hx   . Kidney cancer Neg Hx     Social History   Tobacco Use  . Smoking status: Never Smoker  . Smokeless tobacco: Never Used  Vaping Use  . Vaping Use: Never used  Substance Use Topics  . Alcohol use: Yes  . Drug use:  No    Prior to Admission medications   Medication Sig Start Date End Date Taking? Authorizing Provider  aspirin 81 MG tablet Take 81 mg by mouth daily as needed for pain.   Yes [provider]  Cholecalciferol (VITAMIN D-3) 1000 UNITS CAPS Take 1,000 Units by mouth daily.    Yes [provider]  EPIPEN 2-PAK 0.3 MG/0.3ML SOAJ injection Inject into the muscle once. 03/05/20  Yes [provider]  diltiazem (CARDIZEM LA) 180 MG 24 hr tablet Take 1 tablet (180 mg total) by mouth daily. 06/08/20   Jamareon Shimel, MD    Allergies Apple, Cephalosporins, Other, and Penicillins   REVIEW OF SYSTEMS  Negative except as noted here or in the History of Present Illness.   PHYSICAL EXAMINATION  Initial Vital Signs Blood pressure (!) 141/115, pulse 82, temperature 97.7 F (36.5 C), temperature source Oral, resp. rate 15, height 5\' 5"  (1.651 m), weight 61.2 kg, SpO2 97 %.  Examination General: Well-developed, well-nourished female in no acute distress; appearance consistent with age of record HENT: normocephalic; atraumatic Eyes: pupils equal, round and reactive to light; extraocular muscles intact  Neck: supple Heart: Regular rhythm; tachycardia (mostly PACs with occasional P waves noted) Lungs: clear to auscultation bilaterally Abdomen: soft; nondistended; nontender; bowel sounds present Extremities: No deformity; full range of motion; pulses normal Neurologic: Awake, alert and oriented; motor function intact in all extremities and symmetric; no facial droop Skin: Warm and dry Psychiatric: Normal mood and affect   RESULTS  Summary of this visit's results, reviewed and interpreted by myself:   EKG Interpretation  Date/Time:  Sunday June 07 2020 22:12:30 EST Ventricular Rate:  124 PR Interval:    QRS Duration: 88 QT Interval:  373 QTC Calculation: 498 R Axis:   -83 Text Interpretation: Sinus rhythm with frequent PACs Previously NSR Reconfirmed by Agustin Swatek,  Ceylin Dreibelbis [phone]) on 06/07/2020 10:31:54 PM       EKG Interpretation  Date/Time:  Monday June 08 2020 01:34:38 EST Ventricular Rate:  89 PR Interval:    QRS Duration: 78 QT Interval:  357 QTC Calculation: 435 R Axis:   37 Text Interpretation: Sinus rhythm Probable left atrial enlargement Previously irregular rhythm Confirmed by Keilon Ressel [phone]) on 06/08/2020 1:42:34 AM       Laboratory Studies: Results for orders placed or performed during the hospital encounter of 06/07/20 (from the past 24 hour(s))  Basic metabolic panel     Status: Abnormal   Collection Time: 06/07/20 10:23 PM  Result Value Ref Range   Sodium 140 135 - 145 mmol/L   Potassium 3.5 3.5 - 5.1 mmol/L   Chloride 108 98 - 111 mmol/L   CO2 22 22 - 32 mmol/L   Glucose, Bld 126 (H) 70 - 99 mg/dL   BUN 14 6 - 20 mg/dL   Creatinine, Ser 0.75 0.44 - 1.00 mg/dL   Calcium 8.9 8.9 - 10.3 mg/dL   GFR, Estimated >60 >60 mL/min   Anion gap 10 5 - 15  CBC     Status: Abnormal   Collection Time: 06/07/20 10:23 PM  Result Value Ref Range   WBC 9.6 4.0 - 10.5 K/uL   RBC 5.16 (H) 3.87 - 5.11 MIL/uL   Hemoglobin 14.8 12.0 - 15.0 g/dL   HCT 43.8 36 - 46 %   MCV 84.9 80.0 - 100.0 fL   MCH 28.7 26.0 - 34.0 pg   MCHC 33.8 30.0 - 36.0 g/dL   RDW 13.5 11.5 - 15.5 %   Platelets 272 150 - 400 K/uL   nRBC 0.0 0.0 - 0.2 %  Troponin I (High Sensitivity)     Status: None   Collection Time: 06/07/20 10:23 PM  Result Value Ref Range   Troponin I (High Sensitivity) <2 <18 ng/L  I-Stat beta hCG blood, ED     Status: None   Collection Time: 06/07/20 10:29 PM  Result Value Ref Range   I-stat hCG, quantitative <5.0 <5 mIU/mL   Comment 3          Troponin I (High Sensitivity)     Status: None   Collection Time: 06/08/20 12:21 AM  Result Value Ref Range   Troponin I (High Sensitivity) 4 <18 ng/L   Imaging Studies: No results found.  ED COURSE and MDM  Nursing notes, initial and subsequent vitals signs, including pulse  oximetry, reviewed and interpreted by myself.  Vitals:   06/07/20 2300 06/08/20 0000 06/08/20 0100 06/08/20 0130  BP: 100/71 (!) 159/99 (!) 152/115 111/86  Pulse: 92 (!) 48 (!) 101 81  Resp: 11 20 19 14   Temp:      TempSrc:  SpO2: 98% 99% 99% 97%  Weight:      Height:       Medications  diltiazem (CARDIZEM CD) 24 hr capsule 180 mg (has no administration in time range)  calcium gluconate 1 g/ 50 mL sodium chloride IVPB (0 g Intravenous Stopped 06/08/20 0135)   12:06 AM Patient given Cardizem bolus and started on Cardizem drip for rate control.  Calcium gluconate 1 g ordered to help prevent Cardizem induced hypotension.   1:38 AM Patient now in normal sinus rhythm.  Patient's palpitations have resolved.  We will start her on Cardizem CD 180 mg daily for rate control and have her follow-up with her cardiologist, Dr. Irish Lack.   PROCEDURES  Procedures   ED DIAGNOSES     ICD-10-CM   1. Irregular heart rhythm  I49.9        Ashia Dehner, Jenny Reichmann, MD 06/08/20 [phone]

## 2020-06-07 NOTE — ED Triage Notes (Addendum)
Pt reports chest pain, dizziness, SOB, and feeling like her heart is pounding x 2 hours. States that she also had some mild indigestion prior to the event. Symptoms started while she was laying down at home. She reports a hx of palpitations and PACs. A&Ox4.

## 2020-06-08 LAB — TROPONIN I (HIGH SENSITIVITY): Troponin I (High Sensitivity): 4 ng/L (ref <18)

## 2020-06-08 MED ORDER — CALCIUM GLUCONATE-NACL 1-0.675 GM/50ML-% IV SOLN
1.0000 g | Freq: Once | INTRAVENOUS | Status: AC
  Administered 2020-06-08: 1000 mg via INTRAVENOUS
  Filled 2020-06-08: qty 50

## 2020-06-08 MED ORDER — DILTIAZEM HCL ER COATED BEADS 180 MG PO CP24
180.0000 mg | ORAL_CAPSULE | Freq: Once | ORAL | Status: AC
  Administered 2020-06-08: 180 mg via ORAL
  Filled 2020-06-08: qty 1

## 2020-06-08 MED ORDER — DILTIAZEM LOAD VIA INFUSION
20.0000 mg | Freq: Once | INTRAVENOUS | Status: DC
  Filled 2020-06-08: qty 20

## 2020-06-08 MED ORDER — DILTIAZEM HCL 25 MG/5ML IV SOLN: 20.0000 mg | Freq: Once | INTRAVENOUS | Status: DC

## 2020-06-08 MED ORDER — DILTIAZEM HCL ER COATED BEADS 180 MG PO TB24: 180.0000 mg | ORAL_TABLET | Freq: Every day | ORAL | 0 refills | Status: DC

## 2020-06-08 MED ORDER — DILTIAZEM HCL-DEXTROSE 125-5 MG/125ML-% IV SOLN (PREMIX)
5.0000 mg/h | INTRAVENOUS | Status: DC
  Administered 2020-06-08: 5 mg/h via INTRAVENOUS
  Filled 2020-06-08: qty 125

## 2020-06-09 ENCOUNTER — Telehealth: Payer: Self-pay | Admitting: Interventional Cardiology

## 2020-06-09 NOTE — Telephone Encounter (Signed)
I spoke with patient. Recently seen in ED for elevated heart rate. Was told EKG showed a lot of PAC's.  She has reviewed EKGs in my chart and is concerned about possible afib.  States she has been having episodes of chaotic racing heart rates at home.  Was to take Cardizem last night but did not take due to low BP.  Currently she is feeling fine.  Heart rate in the 60's.  BP 90-108/60's.  She is requesting sooner appointment and asking if she should take cardizem I scheduled patient to see Dr Irish Lack on 06/11/20 at 2:00.  I advised her to hold Cardizem if BP less than 711 systolic

## 2020-06-09 NOTE — Telephone Encounter (Signed)
Patient feels like she needs to be seen sooner than what she is currently scheduled. She was discharged from [facility]. Her EKG shows Afib and she needs to know what to

## 2020-06-10 NOTE — Telephone Encounter (Signed)
Agree, ECG from 11/14 looks like sinus with PACs.

## 2020-06-10 NOTE — Telephone Encounter (Signed)
Thanks Will.

## 2020-06-10 NOTE — Telephone Encounter (Signed)
I agree.  May need to consider heart monitor at visit.  She was back in NSR by the end of the ECG.  Will, what do you think of her 11/14 ECG?  Is the first half AFib, or PACs which she has had for some time. She has been symptomatic with PACs in the past.

## 2020-06-11 ENCOUNTER — Telehealth: Payer: Self-pay | Admitting: Radiology

## 2020-06-11 ENCOUNTER — Ambulatory Visit: Payer: BC Managed Care – PPO | Admitting: Interventional Cardiology

## 2020-06-11 ENCOUNTER — Other Ambulatory Visit: Payer: Self-pay

## 2020-06-11 ENCOUNTER — Encounter: Payer: Self-pay | Admitting: Interventional Cardiology

## 2020-06-11 VITALS — BP 122/80 | HR 98 | Ht 65.0 in | Wt 133.6 lb

## 2020-06-11 DIAGNOSIS — F419 Anxiety disorder, unspecified: Secondary | ICD-10-CM | POA: Diagnosis not present

## 2020-06-11 DIAGNOSIS — R002 Palpitations: Secondary | ICD-10-CM | POA: Diagnosis not present

## 2020-06-11 DIAGNOSIS — I491 Atrial premature depolarization: Secondary | ICD-10-CM | POA: Diagnosis not present

## 2020-06-11 DIAGNOSIS — I959 Hypotension, unspecified: Secondary | ICD-10-CM | POA: Diagnosis not present

## 2020-06-11 NOTE — Patient Instructions (Signed)
Medication Instructions:  Your physician recommends that you continue on your current medications as directed. Please refer to the Current Medication list given to you today.  *If you need a refill on your cardiac medications before your next appointment, please call your pharmacy*   Lab Work: None  If you have labs (blood work) drawn today and your tests are completely normal, you will receive your results only by: Marland Kitchen MyChart Message (if you have MyChart) OR . A paper copy in the mail If you have any lab test that is abnormal or we need to change your treatment, we will call you to review the results.   Testing/Procedures: Your physician has recommended that you wear a 14 day monitor. These monitors are medical devices that record the heart's electrical activity. Doctors most often use these monitors to diagnose arrhythmias. Arrhythmias are problems with the speed or rhythm of the heartbeat. The monitor is a small, portable device. You can wear one while you do your normal daily activities. This is usually used to diagnose what is causing palpitations/syncope (passing out).  Follow-Up: Based on test results   Other Instructions ZIO XT- Long Term Monitor Instructions   Your physician has requested you wear your ZIO patch monitor 14 days.   This is a single patch monitor.  [employer] supplies one patch monitor per enrollment.  Additional stickers are not available.   Please do not apply patch if you will be having a Nuclear Stress Test, Echocardiogram, Cardiac CT, MRI, or Chest Xray during the time frame you would be wearing the monitor. The patch cannot be worn during these tests.  You cannot remove and re-apply the ZIO XT patch monitor.   Your ZIO patch monitor will be sent USPS Priority mail from [facility] directly to your home address. The monitor may also be mailed to a PO BOX if home delivery is not available.   It may take 3-5 days to receive your monitor after you have  been enrolled.   Once you have received you monitor, please review enclosed instructions.  Your monitor has already been registered assigning a specific monitor serial # to you.   Applying the monitor   Shave hair from upper left chest.   Hold abrader disc by orange tab.  Rub abrader in 40 strokes over left upper chest as indicated in your monitor instructions.   Clean area with 4 enclosed alcohol pads .  Use all pads to assure are is cleaned thoroughly.  Let dry.   Apply patch as indicated in monitor instructions.  Patch will be place under collarbone on left side of chest with arrow pointing upward.   Rub patch adhesive wings for 2 minutes.Remove white label marked "1".  Remove white label marked "2".  Rub patch adhesive wings for 2 additional minutes.   While looking in a mirror, press and release button in center of patch.  A small green light will flash 3-4 times .  This will be your only indicator the monitor has been turned on.     Do not shower for the first 24 hours.  You may shower after the first 24 hours.   Press button if you feel a symptom. You will hear a small click.  Record Date, Time and Symptom in the Patient Log Book.   When you are ready to remove patch, follow instructions on last 2 pages of Patient Log Book.  Stick patch monitor onto last page of Patient Log Book.   Place  Patient Log Book in [facility] box.  Use locking tab on box and tape box closed securely.  The [employer] has [employer] on it.  Please place in mailbox as soon as possible.  Your physician should have your test results approximately 7 days after the monitor has been mailed back to [facility].   Call [place] at [phone] if you have questions regarding your ZIO XT patch monitor.  Call them immediately if you see an orange light blinking on your monitor.   If your monitor falls off in less than 4 days contact our Monitor department at [phone].  If your  monitor becomes loose or falls off after 4 days call [employer] at [phone] for suggestions on securing your monitor.

## 2020-06-11 NOTE — Telephone Encounter (Signed)
Enrolled patient for a 14 day Zio XT  monitor to be mailed to patients home  °

## 2020-06-11 NOTE — Progress Notes (Signed)
Cardiology Office Note   Date:  06/11/2020   ID:  Shelly Mahoney, DOB 06/28/1961, MRN [id number]  PCP:  Shelly Amel, MD    No chief complaint on file.  PACs, palpitations  Wt Readings from Last 3 Encounters:  06/11/20 133 lb 9.6 oz (60.6 kg)  06/07/20 135 lb (61.2 kg)  05/08/18 129 lb 9.6 oz (58.8 kg)       History of Present Illness: Shelly Mahoney is a 59 y.o. female  Who has had palpitations in the past.  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  She had a Holter monitor in 2014 and this was benign per her report. Reported PACs.   Records reviewed showed: "history of uterine fibroids and severe menorrhagia about 10 years ago and was told that she had a heart murmur from her anemia. She had a hysterectomy which resolved her anemia.."  She had a syncopal episode in 2014.  Seen in 2019.  Had sx of isolated premature beats.  Exercise was also recommended.   She also had atypical chest pain in 2019.  We did not do any testing at that time.  She was going to let us know if symptoms got worse or became more typical.  Over the past few months, she is having more PACs.  Sudden onset of pounding in her chest.  No triggers.  She felt pulse was irregular. Worse at night when she is relaxing.  Sx are lasting longer than usual.   On 11/15, she had some heartburn after eating.  She felt the palpitations and went to the ER. Initially, there was a concern for AFib.  She was given IV cardizem.  ECG was benign after this.    She was given oral cardizem, but her BP was low at home so she stopped it.  Past Medical History:  Diagnosis Date  . ADHD    [place]  ATTENTIONS SPECIALIST  . Allergic rhinitis   . Arrhythmia   . Blood in stool   . Fibroid, uterine   . Hemorrhoids   . Herpes   . Menorrhagia   . Palpitations   . Posterior vitreous detachment   . Rectal bleeding   . Syncope   . Viral meningitis     Past Surgical History:  Procedure Laterality Date  .  CESAREAN SECTION  1991  . COLONOSCOPY    . TOTAL ABDOMINAL HYSTERECTOMY  2005     Current Outpatient Medications  Medication Sig Dispense Refill  . Cholecalciferol (VITAMIN D-3) 1000 UNITS CAPS Take 1,000 Units by mouth daily.     Marland Kitchen EPIPEN 2-PAK 0.3 MG/0.3ML SOAJ injection Inject into the muscle once.     No current facility-administered medications for this visit.    Allergies:   Apple, Cephalosporins, Other, and Penicillins    Social History:  The patient  reports that she has never smoked. She has never used smokeless tobacco. She reports current alcohol use. She reports that she does not use drugs.   Family History:  The patient's family history includes Arthritis in her mother; Colon polyps (age of onset: 70) in her father; Hypertension in her mother; Osteoporosis in her mother; Other in her father and mother; Stroke in her mother.    ROS:  Please see the history of present illness.   Otherwise, review of systems are positive for palpitations.   All other systems are reviewed and negative.    PHYSICAL EXAM: VS:  BP 122/80   Pulse 98  Ht 5\' 5"  (1.651 m)   Wt 133 lb 9.6 oz (60.6 kg)   SpO2 96%   BMI 22.23 kg/m  , BMI Body mass index is 22.23 kg/m. GEN: Well nourished, well developed, in no acute distress  HEENT: normal  Neck: no JVD, carotid bruits, or masses Cardiac: RRR; no murmurs, rubs, or gallops,no edema  Respiratory:  clear to auscultation bilaterally, normal work of breathing GI: soft, nontender, nondistended, + BS MS: no deformity or atrophy  Skin: warm and dry, no rash Neuro:  Strength and sensation are intact Psych: euthymic mood, full affect   EKG:   The ekg ordered 11/14, 11/15 demonstrates NSR, PACs   Recent Labs: 10/25/2019: Magnesium 1.9; TSH 1.631 06/07/2020: BUN 14; Creatinine, Ser 0.75; Hemoglobin 14.8; Platelets 272; Potassium 3.5; Sodium 140   Lipid Panel No results found for: CHOL, TRIG, HDL, CHOLHDL, VLDL, LDLCALC, LDLDIRECT   Other  studies Reviewed: Additional studies/ records that were reviewed today with results demonstrating: ER records reviewed.   ASSESSMENT AND PLAN:  1. PACs: 14 day Zio patch.  Known PACs.  Will likely try metoprolol 12.5 BID prn if monitor shows only PACs. If a more sustained arrhythmia, will have to change the plan.  She feels anxious from the sx so if we can find a benign etiology, this will be reassuring. 2. Hypotension: Noted when she was on the diltiazem.  OK to stay off of Dilt.  Stay well hydrated.    Current medicines are reviewed at length with the patient today.  The patient concerns regarding her medicines were addressed.  The following changes have been made:  No change  Labs/ tests ordered today include: monitor No orders of the defined types were placed in this encounter.   Recommend 150 minutes/week of aerobic exercise Low fat, low carb, high fiber diet recommended  Disposition:   FU based on results   Signed, Larae Grooms, MD  06/11/2020 2:20 PM    [facility] [address], [place], [place]  [address] Phone: [phone]; Fax: [phone]

## 2020-06-20 NOTE — Progress Notes (Addendum)
Virtual Visit via Telephone Note   This visit type was conducted due to national recommendations for restrictions regarding the COVID-19 Pandemic (e.g. social distancing) in an effort to limit this patient's exposure and mitigate transmission in our community.  Due to her co-morbid illnesses, this patient is at least at moderate risk for complications without adequate follow up.  This format is felt to be most appropriate for this patient at this time.  The patient did not have access to video technology/had technical difficulties with video requiring transitioning to audio format only (telephone).  All issues noted in this document were discussed and addressed.  No physical exam could be performed with this format.  Please refer to the patient's chart for her  consent to telehealth for [facility].   The patient was identified using 2 identifiers.  Date:  06/23/2020   ID:  Shelly Mahoney, DOB 1961/06/03, MRN [id number]  Patient Location: Home Provider Location: Office/Clinic  PCP:  Shelly Amel, MD  Cardiologist:  Shelly Grooms, MD  Electrophysiologist:  None   Evaluation Performed:  Follow-Up Visit  Chief Complaint:  F/u event monitor  History of Present Illness:    Shelly Mahoney is a 59 y.o. female with longstanding history of PACs, remote syncope 2014, atypical chest pain 2019 (testing not pursued at that time), allergic rhinitis, uterine fibroids s/p hysterectomy, ADHD, viral meningitis, baseline hypotension whom is seen back for palpitations.   She has a remote history of this with reported Holter in 2014 that she indicated was benign. She was seen in the ED for palpitations in 10/2019 with EKG showing NSR with labs that were OK including normal TSH. On 06/08/20 she had developed heartburn after eating and palpitations. She was seen in the ED. Initially there was concern for atrial fib. She was given IV cardizem. Initial EKG was a poor quality tracing in the first half of the  EKG with possible irregularity, then appeared to show NSR with PAC followed by compensatory pause when more clearly defined in the latter half of the tracing.. F/u EKG after that was also NSR. She was started on oral diltiazem but had low BP with this so stopped it. Event monitor was ordered which she just placed on the other day. Although monitor is still pending she wished to proceed with appointment to relay a pertinent update in her symptoms and had many questions about her recent issues as well. These episodes have only happened while resting, not with exertion.  She had a recurrent episode on 06/21/20 where her heart rate again went abruptly in the 150s for several hours and her BP was 140s-150s, which was unusual for her (at baseline her HR is in the 60s and BP runs low around 90/70). She was unfortunately not yet wearing the monitor at that time. After it didn't go away she took a Tbsp of rum and symptoms resolved. Her immediate follow-up BP was 80s/60s and HR 62. It then went back to her normalized values. She had no other associated symptoms at that time. She has since begun wearing the monitor and has not had any recurrence.   She also notes she was remotely diagnosed with a heart murmur when she was profoundly anemic in the setting of uterine fibroids/menorrhagia requiring hysterectomy. This resolved her anemia.  Labs Independently Reviewed 05/2020 troponins negative, Hgb wnl, K 3.5, Cr 0.75 10/2019 TSH wnl, Mg 1.9   Past Medical History:  Diagnosis Date  . ADHD    [place]  ATTENTIONS SPECIALIST  .  Allergic rhinitis   . Arrhythmia   . Blood in stool   . Fibroid, uterine   . Hemorrhoids   . Herpes   . Menorrhagia   . Palpitations   . Posterior vitreous detachment   . Rectal bleeding   . Syncope   . Viral meningitis    Past Surgical History:  Procedure Laterality Date  . CESAREAN SECTION  1991  . COLONOSCOPY    . TOTAL ABDOMINAL HYSTERECTOMY  2005     Current Meds   Medication Sig  . Cholecalciferol (VITAMIN D-3) 1000 UNITS CAPS Take 1,000 Units by mouth daily.   Marland Kitchen EPIPEN 2-PAK 0.3 MG/0.3ML SOAJ injection Inject into the muscle once.     Allergies:   Apple, Cephalosporins, Other, and Penicillins   Social History   Tobacco Use  . Smoking status: Never Smoker  . Smokeless tobacco: Never Used  Vaping Use  . Vaping Use: Never used  Substance Use Topics  . Alcohol use: Yes  . Drug use: No     Family Hx: The patient's family history includes Arthritis in her mother; Colon polyps (age of onset: 70) in her father; Hypertension in her mother; Osteoporosis in her mother; Other in her father and mother; Stroke in her mother. There is no history of Colon cancer, CAD, Liver cancer, or Kidney cancer.  ROS:   Please see the history of present illness.    All other systems reviewed and are negative.   Prior CV studies:   The following studies were reviewed today:  Monitor pending    Labs/Other Tests and Data Reviewed:    EKG:  An ECG dated 06/08/20 was personally reviewed today and demonstrated:  EKG was a poor quality tracing in the first half of the EKG with possible irregularity, then appeared to show NSR with PAC followed by compensatory pause  Recent Labs: 10/25/2019: Magnesium 1.9; TSH 1.631 06/07/2020: BUN 14; Creatinine, Ser 0.75; Hemoglobin 14.8; Platelets 272; Potassium 3.5; Sodium 140   Recent Lipid Panel No results found for: CHOL, TRIG, HDL, CHOLHDL, LDLCALC, LDLDIRECT  Wt Readings from Last 3 Encounters:  06/23/20 133 lb 9.6 oz (60.6 kg)  06/11/20 133 lb 9.6 oz (60.6 kg)  06/07/20 135 lb (61.2 kg)     Objective:    Vital Signs:  Ht 5\' 6"  (1.676 m)   Wt 133 lb 9.6 oz (60.6 kg)   BMI 21.56 kg/m    VS reviewed. General - adult F in no acute distress Pulm - No labored breathing, no coughing during visit, no audible wheezing, speaking in full sentences Neuro - A+Ox3, no slurred speech, answers questions appropriately Psych -  Pleasant affect  ASSESSMENT & PLAN:    1. Palpitations - given description, these are suspicious for episodic SVT, AT, AF, or AFL given the abrupt heart rate change. Since she has been able to tolerate this for several hours at a time, seems less likely that this would represent something like VT. Her EKG in the ER was challenging to interpret but did suggest some irregularity raising question of afib as previously mentioned by Dr. Irish Lack who suggested event monitor to further define these events. She just placed the monitor several days ago which will give [place] the most information. If she does not experience a recurrence while wearing this, we may need to have her repeat this. Would hold off on empiric rx for palpitations until we see what these represent. Magnesium and TSH were OK in 10/2019. Potassium is lower end of  normal but given that we'd like to capture these events on the monitor, would hold off supplementation for now but encouraged potassium rich foods in the meantime. Will also obtain a baseline echocardiogram. I told her if she has another recurrence while wearing the monitor to call the office so we can see if we can download the strip. If arrhythmia is confirmed she may benefit from seeing EP for antiarrhythmic therapy given that her baseline BP prohibits the usual preventative measures. 2. Premature atrial contractions - will assess burden by monitor as above. 3. Hyperglycemia - mildly elevated by ED review, recommended to follow-up PCP for this.  4. Heart murmur - she reports a remotely diagnosed heart murmur in the context of anemia. No prior echo on file. Will obtain baseline echo given palpitations.  Time:   Today, I have spent 19 minutes with the patient with telehealth technology discussing the above problems.     Medication Adjustments/Labs and Tests Ordered: Current medicines are reviewed at length with the patient today.  Testing and concerns regarding medicines are outlined  above.    Follow Up:  Already scheduled mid December with Dr. Irish Lack  Signed, [name], PA-C  06/23/2020 10:11 AM    [place]

## 2020-06-21 ENCOUNTER — Ambulatory Visit (INDEPENDENT_AMBULATORY_CARE_PROVIDER_SITE_OTHER): Payer: BC Managed Care – PPO

## 2020-06-21 DIAGNOSIS — I491 Atrial premature depolarization: Secondary | ICD-10-CM

## 2020-06-23 ENCOUNTER — Other Ambulatory Visit: Payer: Self-pay

## 2020-06-23 ENCOUNTER — Encounter: Payer: Self-pay | Admitting: *Deleted

## 2020-06-23 ENCOUNTER — Encounter: Payer: Self-pay | Admitting: Physician Assistant

## 2020-06-23 ENCOUNTER — Telehealth (INDEPENDENT_AMBULATORY_CARE_PROVIDER_SITE_OTHER): Payer: BC Managed Care – PPO | Admitting: Physician Assistant

## 2020-06-23 ENCOUNTER — Telehealth: Payer: Self-pay | Admitting: *Deleted

## 2020-06-23 VITALS — Ht 66.0 in | Wt 133.6 lb

## 2020-06-23 DIAGNOSIS — R011 Cardiac murmur, unspecified: Secondary | ICD-10-CM | POA: Diagnosis not present

## 2020-06-23 DIAGNOSIS — R739 Hyperglycemia, unspecified: Secondary | ICD-10-CM

## 2020-06-23 DIAGNOSIS — I491 Atrial premature depolarization: Secondary | ICD-10-CM

## 2020-06-23 DIAGNOSIS — R002 Palpitations: Secondary | ICD-10-CM | POA: Diagnosis not present

## 2020-06-23 NOTE — Telephone Encounter (Signed)
Replied in duplicate mychart msg

## 2020-06-23 NOTE — Telephone Encounter (Signed)
Since the patient sent 2 MyChart messages about this, I think she wanted this one to go to Dr. Irish Lack for his review. This is a copy of the ER EKG from 11/14 so I assume he had access to it when he first saw her but will forward per her request. I did a f/u virtual visit with her today but she only just started the event monitor which will be most helpful to better define these events.

## 2020-06-23 NOTE — Patient Instructions (Signed)
Medication Instructions:  Your physician recommends that you continue on your current medications as directed. Please refer to the Current Medication list given to you today.  *If you need a refill on your cardiac medications before your next appointment, please call your pharmacy*   Lab Work: None ordered  If you have labs (blood work) drawn today and your tests are completely normal, you will receive your results only by: Marland Kitchen MyChart Message (if you have MyChart) OR . A paper copy in the mail If you have any lab test that is abnormal or we need to change your treatment, we will call you to review the results.   Testing/Procedures: Your physician has requested that you have an echocardiogram. Echocardiography is a painless test that uses sound waves to create images of your heart. It provides your doctor with information about the size and shape of your heart and how well your heart's chambers and valves are working. This procedure takes approximately one hour. There are no restrictions for this procedure.     Follow-Up: At [facility], you and your health needs are our priority.  As part of our continuing mission to provide you with exceptional heart care, we have created designated Provider Care Teams.  These Care Teams include your primary Cardiologist (physician) and Advanced Practice Providers (APPs -  Physician Assistants and Nurse Practitioners) who all work together to provide you with the care you need, when you need it.  We recommend signing up for the patient portal called "MyChart".  Sign up information is provided on this After Visit Summary.  MyChart is used to connect with patients for Virtual Visits (Telemedicine).  Patients are able to view lab/test results, encounter notes, upcoming appointments, etc.  Non-urgent messages can be sent to your provider as well.   To learn more about what you can do with MyChart, go to [email or web address].    Your next appointment:    KEEP SCHEDULED APPT 07/09/20 WITH DR. VARANASI AS SCHEDULED   The format for your next appointment:   In Person  Provider:   Casandra Doffing, MD   Other Instructions  Echocardiogram An echocardiogram is a procedure that uses painless sound waves (ultrasound) to produce an image of the heart. Images from an echocardiogram can provide important information about:  Signs of coronary artery disease (CAD).  Aneurysm detection. An aneurysm is a weak or damaged part of an artery wall that bulges out from the normal force of blood pumping through the body.  Heart size and shape. Changes in the size or shape of the heart can be associated with certain conditions, including heart failure, aneurysm, and CAD.  Heart muscle function.  Heart valve function.  Signs of a past heart attack.  Fluid buildup around the heart.  Thickening of the heart muscle.  A tumor or infectious growth around the heart valves. Tell a health care provider about:  Any allergies you have.  All medicines you are taking, including vitamins, herbs, eye drops, creams, and over-the-counter medicines.  Any blood disorders you have.  Any surgeries you have had.  Any medical conditions you have.  Whether you are pregnant or may be pregnant. What are the risks? Generally, this is a safe procedure. However, problems may occur, including:  Allergic reaction to dye (contrast) that may be used during the procedure. What happens before the procedure? No specific preparation is needed. You may eat and drink normally. What happens during the procedure?   An IV tube may be inserted  into one of your veins.  You may receive contrast through this tube. A contrast is an injection that improves the quality of the pictures from your heart.  A gel will be applied to your chest.  A wand-like tool (transducer) will be moved over your chest. The gel will help to transmit the sound waves from the transducer.  The sound  waves will harmlessly bounce off of your heart to allow the heart images to be captured in real-time motion. The images will be recorded on a computer. The procedure may vary among health care providers and hospitals. What happens after the procedure?  You may return to your normal, everyday life, including diet, activities, and medicines, unless your health care provider tells you not to do that. Summary  An echocardiogram is a procedure that uses painless sound waves (ultrasound) to produce an image of the heart.  Images from an echocardiogram can provide important information about the size and shape of your heart, heart muscle function, heart valve function, and fluid buildup around your heart.  You do not need to do anything to prepare before this procedure. You may eat and drink normally.  After the echocardiogram is completed, you may return to your normal, everyday life, unless your health care provider tells you not to do that. This information is not intended to replace advice given to you by your health care provider. Make sure you discuss any questions you have with your health care provider. Document Revised: 11/01/2018 Document Reviewed: 08/13/2016 Elsevier Patient Education  [place].

## 2020-06-23 NOTE — Telephone Encounter (Signed)
  Patient Consent for Virtual Visit         Shelly Mahoney has provided verbal consent on 06/23/2020 for a virtual visit (video or telephone).   CONSENT FOR VIRTUAL VISIT FOR:  Shelly Mahoney  By participating in this virtual visit I agree to the following:  I hereby voluntarily request, consent and authorize [facility] and its employed or contracted physicians, physician assistants, nurse practitioners or other licensed health care professionals (the Practitioner), to provide me with telemedicine health care services (the "Services") as deemed necessary by the treating Practitioner. I acknowledge and consent to receive the Services by the Practitioner via telemedicine. I understand that the telemedicine visit will involve communicating with the Practitioner through live audiovisual communication technology and the disclosure of certain medical information by electronic transmission. I acknowledge that I have been given the opportunity to request an in-person assessment or other available alternative prior to the telemedicine visit and am voluntarily participating in the telemedicine visit.  I understand that I have the right to withhold or withdraw my consent to the use of telemedicine in the course of my care at any time, without affecting my right to future care or treatment, and that the Practitioner or I may terminate the telemedicine visit at any time. I understand that I have the right to inspect all information obtained and/or recorded in the course of the telemedicine visit and may receive copies of available information for a reasonable fee.  I understand that some of the potential risks of receiving the Services via telemedicine include:  Marland Kitchen Delay or interruption in medical evaluation due to technological equipment failure or disruption; . Information transmitted may not be sufficient (e.g. poor resolution of images) to allow for appropriate medical decision making by the Practitioner;  and/or  . In rare instances, security protocols could fail, causing a breach of personal health information.  Furthermore, I acknowledge that it is my responsibility to provide information about my medical history, conditions and care that is complete and accurate to the best of my ability. I acknowledge that Practitioner's advice, recommendations, and/or decision may be based on factors not within their control, such as incomplete or inaccurate data provided by me or distortions of diagnostic images or specimens that may result from electronic transmissions. I understand that the practice of medicine is not an exact science and that Practitioner makes no warranties or guarantees regarding treatment outcomes. I acknowledge that a copy of this consent can be made available to me via my patient portal ([place]), or I can request a printed copy by calling the office of [facility].    I understand that my insurance will be billed for this visit.   I have read or had this consent read to me. . I understand the contents of this consent, which adequately explains the benefits and risks of the Services being provided via telemedicine.  . I have been provided ample opportunity to ask questions regarding this consent and the Services and have had my questions answered to my satisfaction. . I give my informed consent for the services to be provided through the use of telemedicine in my medical care

## 2020-07-02 ENCOUNTER — Emergency Department (HOSPITAL_COMMUNITY): Payer: BC Managed Care – PPO

## 2020-07-02 ENCOUNTER — Emergency Department (HOSPITAL_COMMUNITY)
Admission: EM | Admit: 2020-07-02 | Discharge: 2020-07-02 | Disposition: A | Discharge status: Home / Self Care | Payer: BC Managed Care – PPO | Attending: Emergency Medicine | Admitting: Emergency Medicine

## 2020-07-02 ENCOUNTER — Other Ambulatory Visit: Payer: Self-pay

## 2020-07-02 ENCOUNTER — Encounter (HOSPITAL_COMMUNITY): Payer: Self-pay

## 2020-07-02 DIAGNOSIS — I4891 Unspecified atrial fibrillation: Secondary | ICD-10-CM | POA: Insufficient documentation

## 2020-07-02 DIAGNOSIS — R002 Palpitations: Secondary | ICD-10-CM | POA: Diagnosis present

## 2020-07-02 LAB — URINALYSIS, ROUTINE W REFLEX MICROSCOPIC
Bacteria, UA: NONE SEEN
Bilirubin Urine: NEGATIVE
Glucose, UA: NEGATIVE mg/dL
Hgb urine dipstick: NEGATIVE
Ketones, ur: NEGATIVE mg/dL
Nitrite: NEGATIVE
Protein, ur: NEGATIVE mg/dL
Specific Gravity, Urine: 1.003 — ABNORMAL LOW (ref 1.005–1.030)
pH: 7 (ref 5.0–8.0)

## 2020-07-02 MED ORDER — HEPARIN BOLUS VIA INFUSION
3500.0000 [IU] | Freq: Once | INTRAVENOUS | Status: DC
  Filled 2020-07-02: qty 3500

## 2020-07-02 MED ORDER — DILTIAZEM LOAD VIA INFUSION
10.0000 mg | Freq: Once | INTRAVENOUS | Status: DC
  Filled 2020-07-02: qty 10

## 2020-07-02 MED ORDER — HEPARIN (PORCINE) 25000 UT/250ML-% IV SOLN: 900.0000 [IU]/h | INTRAVENOUS | Status: DC

## 2020-07-02 MED ORDER — DILTIAZEM HCL-DEXTROSE 125-5 MG/125ML-% IV SOLN (PREMIX)
5.0000 mg/h | INTRAVENOUS | Status: DC
  Filled 2020-07-02: qty 125

## 2020-07-02 MED ORDER — APIXABAN 5 MG PO TABS
5.0000 mg | ORAL_TABLET | Freq: Two times a day (BID) | ORAL | Status: DC
  Administered 2020-07-02: 5 mg via ORAL
  Filled 2020-07-02: qty 1

## 2020-07-02 MED ORDER — DILTIAZEM HCL 60 MG PO TABS
60.0000 mg | ORAL_TABLET | Freq: Once | ORAL | Status: DC
  Filled 2020-07-02: qty 1

## 2020-07-02 MED ORDER — DILTIAZEM HCL ER COATED BEADS 120 MG PO CP24: 120.0000 mg | ORAL_CAPSULE | Freq: Every day | ORAL | 1 refills | Status: DC

## 2020-07-02 NOTE — ED Notes (Signed)
Patient verbalizes understanding of discharge instructions. Opportunity for questioning and answers were provided. Armband removed by staff, pt discharged from ED ambulatory to home.  

## 2020-07-02 NOTE — ED Provider Notes (Signed)
[facility] EMERGENCY DEPARTMENT Provider Note   CSN: [id number] Arrival date & time: 07/02/20  2151     History Chief Complaint  Patient presents with  . Atrial Fibrillation    Shelly Mahoney is a 59 y.o. female.  HPI   This patient is a 59 year old female, she has a history of no prior cardiac medical problems though she has had some intermittent palpitations over time, usually these are short-lived and resolved spontaneously but she has recently seen Dr. Illene Bolus with the cardiology service and had a cardiac monitor placed, this was approximately 10 days ago to try to pick up any specific arrhythmias.  This evening the patient developed acute onset of palpitations feeling like her heart was pounding, she was dizzy felt like her arms and legs were tingling and she was going to pass out.  She took her heart rate which was around 150 bpm and irregular, she measured her blood pressure which was slightly elevated and decided to take 1 oral diltiazem which she had been instructed to take if this happen.  It did not help, her symptoms were severe, she called paramedics.  They noted the patient to be in atrial fibrillation with a rapid rate between 130 and 180 bpm at one point as high as 190.  They gave 10 mg of diltiazem and the patient's heart rate came down closer to 100.  On arrival it is between 101 140.  She is still having symptoms, she has no fevers or chills, no nausea vomiting or diarrhea, she is not coughing or short of breath he has never had any cardiac disease.  Past Medical History:  Diagnosis Date  . 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 Allergic rhinitis   . Arrhythmia   . Blood in stool   . Fibroid, uterine   . Hemorrhoids   . Herpes   . Menorrhagia   . Palpitations   . Posterior vitreous detachment   . Rectal bleeding   . Syncope   . Viral meningitis     Patient Active Problem List   Diagnosis Date Noted  . Heart palpitations 06/03/2013     Past Surgical History:  Procedure Laterality Date  . CESAREAN SECTION  1991  . COLONOSCOPY    . TOTAL ABDOMINAL HYSTERECTOMY  2005     OB History   No obstetric history on file.     Family History  Problem Relation Age of Onset  . Hypertension Mother   . Stroke Mother   . Arthritis Mother   . Osteoporosis Mother   . Other Mother        PRECANCEROUS ANAL LESION  . Colon polyps Father 24  . Other Father        RESISTANT H. PYLORI  . Colon cancer Neg Hx   . CAD Neg Hx   . Liver cancer Neg Hx   . Kidney cancer Neg Hx     Social History   Tobacco Use  . Smoking status: Never Smoker  . Smokeless tobacco: Never Used  Vaping Use  . Vaping Use: Never used  Substance Use Topics  . Alcohol use: Yes  . Drug use: No    Home Medications Prior to Admission medications   Medication Sig Start Date End Date Taking? Authorizing Provider  Cholecalciferol (VITAMIN D-3) 1000 UNITS CAPS Take 1,000 Units by mouth daily.     [provider]  diltiazem (CARDIZEM CD) 120 MG 24 hr capsule Take 1 capsule (  120 mg total) by mouth daily. 07/02/20 08/01/20  Noemi Chapel, MD  EPIPEN 2-PAK 0.3 MG/0.3ML SOAJ injection Inject into the muscle once. 03/05/20   [provider]    Allergies    Apple, Cephalosporins, Other, and Penicillins  Review of Systems   Review of Systems  All other systems reviewed and are negative.   Physical Exam Updated Vital Signs BP (!) 131/107 (BP Location: Right Arm)   Pulse 87   Temp 98 F (36.7 C) (Oral)   Resp 15   Ht 1.676 m (5\' 6" )   Wt 60.3 kg   SpO2 100%   BMI 21.47 kg/m   Physical Exam Vitals and nursing note reviewed.  Constitutional:      General: She is in acute distress.     Appearance: She is well-developed and well-nourished.  HENT:     Head: Normocephalic and atraumatic.     Mouth/Throat:     Mouth: Oropharynx is clear and moist.     Pharynx: No oropharyngeal exudate.  Eyes:     General: No scleral icterus.        Right eye: No discharge.        Left eye: No discharge.     Extraocular Movements: EOM normal.     Conjunctiva/sclera: Conjunctivae normal.     Pupils: Pupils are equal, round, and reactive to light.  Neck:     Thyroid: No thyromegaly.     Vascular: No JVD.  Cardiovascular:     Rate and Rhythm: Tachycardia present. Rhythm irregular.     Pulses: Intact distal pulses.     Heart sounds: Normal heart sounds. No murmur heard. No friction rub. No gallop.   Pulmonary:     Effort: Pulmonary effort is normal. No respiratory distress.     Breath sounds: Normal breath sounds. No wheezing or rales.  Abdominal:     General: Bowel sounds are normal. There is no distension.     Palpations: Abdomen is soft. There is no mass.     Tenderness: There is no abdominal tenderness.  Musculoskeletal:        General: No tenderness or edema. Normal range of motion.     Cervical back: Normal range of motion and neck supple.  Lymphadenopathy:     Cervical: No cervical adenopathy.  Skin:    General: Skin is warm and dry.     Findings: No erythema or rash.  Neurological:     Mental Status: She is alert.     Coordination: Coordination normal.     Comments: Normal speech and coordination, normal strength in all 4 extremities, normal sensation in all 4 extremities, cranial nerves III through XII are normal, speech is normal, memory is normal, no facial droop  Psychiatric:        Mood and Affect: Mood and affect normal.        Behavior: Behavior normal.     ED Results / Procedures / Treatments   Labs (all labs ordered are listed, but only abnormal results are displayed) Labs Reviewed  RESP PANEL BY RT-PCR (FLU A&B, COVID) [id number]  CBC WITH DIFFERENTIAL/PLATELET  COMPREHENSIVE METABOLIC PANEL  URINALYSIS, ROUTINE W REFLEX MICROSCOPIC  TSH  TROPONIN I (HIGH SENSITIVITY)    EKG EKG Interpretation  Date/Time:  Thursday July 02 2020 21:52:26 EST Ventricular Rate:  95 PR Interval:    QRS  Duration: 90 QT Interval:  380 QTC Calculation: 515 R Axis:   54 Text Interpretation: Atrial flutter with variable block Minimal  ST depression, lateral leads ST elevation, consider inferior injury Prolonged QT interval Since last tracing Normal sinus rhythm has been replaced with flutter Confirmed by Noemi Chapel [phone]) on 07/02/2020 9:53:46 PM   Radiology DG Chest Port 1 View  Result Date: 07/02/2020 CLINICAL DATA:  Palpitations. EXAM: PORTABLE CHEST 1 VIEW COMPARISON:  None. FINDINGS: The heart size and mediastinal contours are within normal limits. Both lungs are clear. The visualized skeletal structures are unremarkable. IMPRESSION: No active disease. Electronically Signed   By: Virgina Norfolk M.D.   On: 07/02/2020 22:16    Procedures Procedures (including critical care time)  Medications Ordered in ED Medications  diltiazem (CARDIZEM) 1 mg/mL load via infusion 10 mg (has no administration in time range)    And  diltiazem (CARDIZEM) 125 mg in dextrose 5% 125 mL (1 mg/mL) infusion (has no administration in time range)  apixaban (ELIQUIS) tablet 5 mg (has no administration in time range)  diltiazem (CARDIZEM) tablet 60 mg (has no administration in time range)    ED Course  I have reviewed the triage vital signs and the nursing notes.  Pertinent labs & imaging results that were available during my care of the patient were reviewed by me and considered in my medical decision making (see chart for details).    MDM Rules/Calculators/A&P                          This patient is in acute distress with what appears to be A. fib with RVR.  Currently her heart rate ranges between 101 140, she has had some Cardizem prehospital, she will need more, likely anticoagulate, will discuss with cardiology.  She has been having this coming and going and though I do think you can pinpoint exactly when this started I will discuss with cardiology before further anticoagulation..  Labs reviewed,  TSH was normal in April 2021  Further review of the medical record shows that she was seen and evaluated June 07, 2020 for same - resolved prior to EKG - which was NSR.  CHA2DS2-VASc Score =   1 The patient's score is based upon:       ASSESSMENT AND PLAN: Paroxysmal Atrial Fibrillation (ICD10:  I48.0) The patient's CHA2DS2-VASc score is 1  , indicating a 0.6  % annual risk of stroke.        Signed,  Johnna Acosta, MD    07/02/2020 11:16 PM     I discussed the case with Dr.Varanasi who recommends that the patient can be rate controlled, avoid anticoagulation if possible with heparin however would recommend Eliquis, also recommends that if we can rate control the patient or convert she can be discharged home to follow-up in the A. fib clinic I think this is reasonable.  She is normotensive  EKG repeated, 07/02/2020 at 10:51 PM shows normal sinus rhythm, rate of 88, rightward axis, normal R wave progression, normal ST segments, normal T waves, no left ventricular perjury, otherwise unremarkable EKG, A. fib is now resolved.   Pt given eliquis - has been back in NSR _ referred to afib clinic - pt agreeable -   Final Clinical Impression(s) / ED Diagnoses Final diagnoses:  Atrial fibrillation with rapid ventricular response ([place])    Rx / DC Orders ED Discharge Orders         Ordered    diltiazem (CARDIZEM CD) 120 MG 24 hr capsule  Daily        07/02/20 2314  Noemi Chapel, MD 07/02/20 [phone]

## 2020-07-02 NOTE — Discharge Instructions (Addendum)
Tonight we have seen that your heart rate has been consistent with what is called atrial fibrillation, please read the above instructions.  You should take 1 pill a day to help slow your heart rate down, this may cause a slightly lower blood pressure.  I would like for you to take your blood pressure and measure it 2 hours after taking your medication, this is the only time of the day that I want you to take your blood pressure unless you are feeling poorly.  This may cause your blood pressure to go low which will give you lightheadedness, dizziness or other symptoms so please call your doctor right away if this occurs.  If you feel like your heart is racing please call 911   Information on my medicine - ELIQUIS (apixaban)  This medication education was reviewed with me or my healthcare representative as part of my discharge preparation.  The pharmacist that spoke with me during my hospital stay was:  Lavenia Atlas, [facility]  Why was Eliquis prescribed for you? Eliquis was prescribed for you to reduce the risk of a blood clot forming that can cause a stroke if you have a medical condition called atrial fibrillation (a type of irregular heartbeat).  What do You need to know about Eliquis ? Take your Eliquis TWICE DAILY - one tablet in the morning and one tablet in the evening with or without food. If you have difficulty swallowing the tablet whole please discuss with your pharmacist how to take the medication safely.  Take Eliquis exactly as prescribed by your doctor and DO NOT stop taking Eliquis without talking to the doctor who prescribed the medication.  Stopping may increase your risk of developing a stroke.  Refill your prescription before you run out.  After discharge, you should have regular check-up appointments with your healthcare provider that is prescribing your Eliquis.  In the future your dose may need to be changed if your kidney function or weight changes by a significant  amount or as you get older.  What do you do if you miss a dose? If you miss a dose, take it as soon as you remember on the same day and resume taking twice daily.  Do not take more than one dose of ELIQUIS at the same time to make up a missed dose.  Important Safety Information A possible side effect of Eliquis is bleeding. You should call your healthcare provider right away if you experience any of the following: Bleeding from an injury or your nose that does not stop. Unusual colored urine (red or dark brown) or unusual colored stools (red or black). Unusual bruising for unknown reasons. A serious fall or if you hit your head (even if there is no bleeding).  Some medicines may interact with Eliquis and might increase your risk of bleeding or clotting while on Eliquis. To help avoid this, consult your healthcare provider or pharmacist prior to using any new prescription or non-prescription medications, including herbals, vitamins, non-steroidal anti-inflammatory drugs (NSAIDs) and supplements.  This website has more information on Eliquis (apixaban): http://www.eliquis.com/eliquis/home

## 2020-07-02 NOTE — Progress Notes (Addendum)
ANTICOAGULATION CONSULT NOTE - Initial Consult  Pharmacy Consult for heparin Indication: atrial fibrillation  Allergies  Allergen Reactions  . Apple Nausea Only    Red skin apples Reaction: headaches, nausea Itching of throat  . Cephalosporins Hives  . Other Swelling    Bees , metal (nickel): causes swelling  . Penicillins Hives and Rash    Did it involve swelling of the face/tongue/throat, SOB, or low BP? no Did it involve sudden or severe rash/hives, skin peeling, or any reaction on the inside of your mouth or nose? yes Did you need to seek medical attention at a hospital or doctor's office? no When did it last happen?40 years If all above answers are "NO", may proceed with cephalosporin use.    Patient Measurements: Height: 5\' 6"  (167.6 cm) Weight: 60.3 kg (133 lb) IBW/kg (Calculated) : 59.3 Heparin Dosing Weight: 60.3 kg   Vital Signs: Temp: 98 F (36.7 C) (12/09 2151) Temp Source: Oral (12/09 2151) BP: 131/107 (12/09 2151) Pulse Rate: 87 (12/09 2151)  Labs: No results for input(s): HGB, HCT, PLT, APTT, LABPROT, INR, HEPARINUNFRC, HEPRLOWMOCWT, CREATININE, CKTOTAL, CKMB, TROPONINIHS in the last 72 hours.  CrCl cannot be calculated (Patient's most recent lab result is older than the maximum 21 days allowed.).   Medical History: Past Medical History:  Diagnosis Date  . 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 Allergic rhinitis   . Arrhythmia   . Blood in stool   . Fibroid, uterine   . Hemorrhoids   . Herpes   . Menorrhagia   . Palpitations   . Posterior vitreous detachment   . Rectal bleeding   . Syncope   . Viral meningitis     Medications:  (Not in a hospital admission)   Assessment: 83 YOF with no significant cardiac issues presents to the ED with new onset Afib with RVR. Pharmacy consulted to start IV heparin for primary stroke prevention.   Goal of Therapy:  Heparin level 0.3-0.7 units/ml Monitor platelets by anticoagulation  protocol: Yes   Plan:  -Heparin 3500 units IV bolus followed by heparin infusion at 900 units/hr  -F/u 6 hr HL -Monitor daily HL, CBC and s/s of bleeding   Albertina Parr, PharmD., BCPS, BCCCP Clinical Pharmacist Please refer to [facility] for unit-specific pharmacist     Addendum: MD decided against anticoagulation. Will d/c all heparin order.   Albertina Parr, PharmD., BCPS, BCCCP Clinical Pharmacist Please refer to [facility] for unit-specific pharmacist

## 2020-07-02 NOTE — ED Triage Notes (Signed)
Pt BIB [facility] from home with Atrial Fibrillation RVR.  Pt felt palpitations while driving and pulled over to the side of the road. Pt called EMS when pt felt HR was very high.  EMS on scene upon assessment had a HR of 190. EMS administered 10 mg of Cardizem and pt took 324 ASA.   VSS with EMS besides HR. HR is now sustaining at about 100-130.   Pt A&Ox4. GCS 15.

## 2020-07-03 ENCOUNTER — Telehealth: Payer: Self-pay | Admitting: Interventional Cardiology

## 2020-07-03 MED ORDER — APIXABAN 5 MG PO TABS: 5.0000 mg | ORAL_TABLET | Freq: Two times a day (BID) | ORAL | 11 refills | Status: DC

## 2020-07-03 NOTE — Telephone Encounter (Signed)
Pt called in and stated she was seen in the ED and was told she needed to take a blood thinner but she does not have a script for it .  She would like to know what she needed to do?   Best number  [phone]

## 2020-07-03 NOTE — Telephone Encounter (Signed)
Called and spoke to patient. Sent in Rx for Eliquis 5 mg BID per Dr. Irish Lack. Per Dr. Irish Lack, patient will follow up in [facility]. Appointment made for 12/16.

## 2020-07-09 ENCOUNTER — Encounter (HOSPITAL_COMMUNITY): Payer: Self-pay | Admitting: Physician Assistant

## 2020-07-09 ENCOUNTER — Ambulatory Visit (HOSPITAL_COMMUNITY)
Admission: RE | Admit: 2020-07-09 | Discharge: 2020-07-09 | Disposition: A | Discharge status: Home / Self Care | Payer: BC Managed Care – PPO | Source: Ambulatory Visit | Attending: Physician Assistant | Admitting: Physician Assistant

## 2020-07-09 ENCOUNTER — Other Ambulatory Visit: Payer: Self-pay

## 2020-07-09 ENCOUNTER — Ambulatory Visit: Payer: BC Managed Care – PPO | Admitting: Interventional Cardiology

## 2020-07-09 VITALS — BP 100/74 | HR 75 | Ht 66.0 in | Wt 132.6 lb

## 2020-07-09 DIAGNOSIS — I484 Atypical atrial flutter: Secondary | ICD-10-CM | POA: Diagnosis not present

## 2020-07-09 DIAGNOSIS — I959 Hypotension, unspecified: Secondary | ICD-10-CM | POA: Diagnosis not present

## 2020-07-09 DIAGNOSIS — Z8249 Family history of ischemic heart disease and other diseases of the circulatory system: Secondary | ICD-10-CM | POA: Insufficient documentation

## 2020-07-09 DIAGNOSIS — Z881 Allergy status to other antibiotic agents status: Secondary | ICD-10-CM | POA: Insufficient documentation

## 2020-07-09 DIAGNOSIS — Z8679 Personal history of other diseases of the circulatory system: Secondary | ICD-10-CM | POA: Diagnosis not present

## 2020-07-09 DIAGNOSIS — Z7901 Long term (current) use of anticoagulants: Secondary | ICD-10-CM | POA: Diagnosis not present

## 2020-07-09 DIAGNOSIS — Z880 Allergy status to penicillin: Secondary | ICD-10-CM | POA: Diagnosis not present

## 2020-07-09 DIAGNOSIS — I480 Paroxysmal atrial fibrillation: Secondary | ICD-10-CM | POA: Insufficient documentation

## 2020-07-09 DIAGNOSIS — Z79899 Other long term (current) drug therapy: Secondary | ICD-10-CM | POA: Diagnosis not present

## 2020-07-09 MED ORDER — DILTIAZEM HCL 30 MG PO TABS: ORAL_TABLET | ORAL | 1 refills | Status: DC

## 2020-07-09 NOTE — Progress Notes (Signed)
Primary Care Physician: Lujean Amel, MD Primary Cardiologist: Dr Irish Lack  Primary Electrophysiologist: none Referring Physician: [facility] ED/Dr Loraine Leriche Sangha is a 59 y.o. female with a history of PACs, hypotension, and atrial fibrillation who presents for consultation in the [facility]. The patient was initially diagnosed with atrial fibrillation and atrial flutter 07/02/20 after presenting to the ED with symptoms of palpitations, dizziness, and presyncope. EMS strips showed afib with rates 130s-180s. ECG on arrival to the ED showed atrial flutter with variable block. Patient was started on Eliquis for a CHADS2VASC score of 1 and diltiazem for rate control. She spontaneously converted to SR prior to leaving ED. She has had palpitations for years but cardiac monitoring only showed frequent PACs. Patient reports she never started Eliquis or diltiazem because of concerns about hypotension. She denies significant snoring or alcohol use. She has cut back her caffeine use.   Today, she denies symptoms of palpitations, chest pain, shortness of breath, orthopnea, PND, lower extremity edema, dizziness, presyncope, syncope, snoring, daytime somnolence, bleeding, or neurologic sequela. The patient is tolerating medications without difficulties and is otherwise without complaint today.    Atrial Fibrillation Risk Factors:  she does not have symptoms or diagnosis of sleep apnea. she does not have a history of rheumatic fever. she does not have a history of alcohol use. The patient does not have a history of early familial atrial fibrillation or other arrhythmias.  she has a BMI of Body mass index is 21.4 kg/m.Marland Kitchen Filed Weights   07/09/20 1359  Weight: 60.1 kg    Family History  Problem Relation Age of Onset  . Hypertension Mother   . Stroke Mother   . Arthritis Mother   . Osteoporosis Mother   . Other Mother        PRECANCEROUS ANAL LESION  . Colon  polyps Father 26  . Other Father        RESISTANT H. PYLORI  . Colon cancer Neg Hx   . CAD Neg Hx   . Liver cancer Neg Hx   . Kidney cancer Neg Hx      Atrial Fibrillation Management history:  Previous antiarrhythmic drugs: none Previous cardioversions: none Previous ablations: none CHADS2VASC score: 1 Anticoagulation history: Eliquis   Past Medical History:  Diagnosis Date  . 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 Allergic rhinitis   . Arrhythmia   . Blood in stool   . Fibroid, uterine   . Hemorrhoids   . Herpes   . Menorrhagia   . Palpitations   . Posterior vitreous detachment   . Rectal bleeding   . Syncope   . Viral meningitis    Past Surgical History:  Procedure Laterality Date  . CESAREAN SECTION  1991  . COLONOSCOPY    . TOTAL ABDOMINAL HYSTERECTOMY  2005    Current Outpatient Medications  Medication Sig Dispense Refill  . Cholecalciferol (VITAMIN D-3) 1000 UNITS CAPS Take 1,000 Units by mouth daily.     Marland Kitchen EPIPEN 2-PAK 0.3 MG/0.3ML SOAJ injection Inject into the muscle once.     No current facility-administered medications for this encounter.    Allergies  Allergen Reactions  . Apple Nausea Only    Red skin apples Reaction: headaches, nausea Itching of throat  . Cephalosporins Hives  . Other Swelling    Bees , metal (nickel): causes swelling  . Penicillins Hives and Rash    Did it involve swelling of  the face/tongue/throat, SOB, or low BP? no Did it involve sudden or severe rash/hives, skin peeling, or any reaction on the inside of your mouth or nose? yes Did you need to seek medical attention at a hospital or doctor's office? no When did it last happen?40 years If all above answers are "NO", may proceed with cephalosporin use.    Social History   Socioeconomic History  . Marital status: Married    Spouse name: Not on file  . Number of children: 2  . Years of education: Not on file  . Highest education level: Not on file   Occupational History  . Occupation: HOME MAKER  Tobacco Use  . Smoking status: Never Smoker  . Smokeless tobacco: Never Used  Vaping Use  . Vaping Use: Never used  Substance and Sexual Activity  . Alcohol use: Yes    Alcohol/week: 1.0 standard drink    Types: 1 Glasses of wine per week  . Drug use: No  . Sexual activity: Not on file  Other Topics Concern  . Not on file  Social History Narrative  . Not on file   Social Determinants of Health   Financial Resource Strain: Not on file  Food Insecurity: Not on file  Transportation Needs: Not on file  Physical Activity: Not on file  Stress: Not on file  Social Connections: Not on file  Intimate Partner Violence: Not on file     ROS- All systems are reviewed and negative except as per the HPI above.  Physical Exam: Vitals:   07/09/20 1359  BP: 100/74  Pulse: 75  Weight: 60.1 kg  Height: 5\' 6"  (1.676 m)    GEN- The patient is well appearing, alert and oriented x 3 today.   Head- normocephalic, atraumatic Eyes-  Sclera clear, conjunctiva pink Ears- hearing intact Oropharynx- clear Neck- supple  Lungs- Clear to ausculation bilaterally, normal work of breathing Heart- Regular rate and rhythm, no murmurs, rubs or gallops  GI- soft, NT, ND, + BS Extremities- no clubbing, cyanosis, or edema MS- no significant deformity or atrophy Skin- no rash or lesion Psych- euthymic mood, full affect Neuro- strength and sensation are intact  Wt Readings from Last 3 Encounters:  07/09/20 60.1 kg  07/02/20 60.3 kg  06/23/20 60.6 kg    EKG today demonstrates SR HR 75, PR 136, QRS 82, QTc 437  Epic records are reviewed at length today  CHA2DS2-VASc Score = 1  The patient's score is based upon: CHF History: No HTN History: No Diabetes History: No Stroke History: No Vascular Disease History: No Age Score: 0 Gender Score: 1      ASSESSMENT AND PLAN: 1. Paroxysmal atrial fibrillation/Atypical atrial flutter The patient's  CHA2DS2-VASc score is 1, indicating a 0.6% annual risk of stroke.   General education about afib provided and questions answered. We also discussed her stroke risk and the risks and benefits of anticoagulation. Given her low CV score, will not resume Eliquis at this time. Will start diltiazem 30 mg PRN q 4 hours for heart racing. We discussed therapeutic options today including AAD vs ablation. Patient would like to see how she does with PRN dilt before considering other rhythm options.  Echo cardiogram pending.   Follow up in the AF clinic in one month.    [facility] [address] [place], [place] [address] [phone] 07/09/2020 2:05 PM

## 2020-07-09 NOTE — Patient Instructions (Signed)
Cardizem 30mg  -- take 1 tablet every 4 hours AS NEEDED for afib heart rate >100 as long as top number of blood pressure >100.

## 2020-07-13 ENCOUNTER — Telehealth: Payer: Self-pay | Admitting: Interventional Cardiology

## 2020-07-13 NOTE — Telephone Encounter (Signed)
New Message  [facility] is calling with Abnormal Zio Results

## 2020-07-13 NOTE — Telephone Encounter (Signed)
Spoke with Nicolette from [facility] with abnormal Zio report.  Pt with over 60 seconds of rapid Aflutter at 191 bpm on 07/02/2020 at 9:01pm.  Final report is now available for review.  Will forward information to Dr Irish Lack, ordering physician

## 2020-07-21 ENCOUNTER — Telehealth: Payer: Self-pay | Admitting: *Deleted

## 2020-07-21 ENCOUNTER — Ambulatory Visit (HOSPITAL_COMMUNITY): Payer: BC Managed Care – PPO | Attending: Cardiology

## 2020-07-21 ENCOUNTER — Other Ambulatory Visit: Payer: Self-pay

## 2020-07-21 DIAGNOSIS — I491 Atrial premature depolarization: Secondary | ICD-10-CM | POA: Diagnosis not present

## 2020-07-21 DIAGNOSIS — I480 Paroxysmal atrial fibrillation: Secondary | ICD-10-CM

## 2020-07-21 DIAGNOSIS — I484 Atypical atrial flutter: Secondary | ICD-10-CM

## 2020-07-21 DIAGNOSIS — R002 Palpitations: Secondary | ICD-10-CM | POA: Insufficient documentation

## 2020-07-21 DIAGNOSIS — R739 Hyperglycemia, unspecified: Secondary | ICD-10-CM | POA: Insufficient documentation

## 2020-07-21 LAB — ECHOCARDIOGRAM COMPLETE
Area-P 1/2: 3.65 cm2
S' Lateral: 2.6 cm

## 2020-07-21 NOTE — Telephone Encounter (Signed)
-----   Message from Laurann [place], [place] sent at 07/21/2020  3:11 PM EST ----- Please let patient know echo overall looks good. Normal squeeze function of the main chamber of the heart. There is some stiffness of the heart muscle which is common finding especially in women as they age (not specifically old age, just in middle age and beyond). There is mild dilation of the aorta, the blood vessel that comes out of the heart - again this is a very common finding and nothing that would be of acute concern. We are seeing this pretty commonly on both heart ultrasounds and CT scans. Nothing acutely to do about this other than recommend obtaining repeat echo in 1 year to trend size - can go ahead and arrange.

## 2020-08-11 ENCOUNTER — Encounter (HOSPITAL_COMMUNITY): Payer: Self-pay | Admitting: Physician Assistant

## 2020-08-11 ENCOUNTER — Ambulatory Visit (HOSPITAL_COMMUNITY)
Admission: RE | Admit: 2020-08-11 | Discharge: 2020-08-11 | Disposition: A | Discharge status: Home / Self Care | Payer: BC Managed Care – PPO | Source: Ambulatory Visit | Attending: Physician Assistant | Admitting: Physician Assistant

## 2020-08-11 ENCOUNTER — Other Ambulatory Visit: Payer: Self-pay

## 2020-08-11 VITALS — BP 94/66 | HR 73 | Ht 66.0 in | Wt 135.0 lb

## 2020-08-11 DIAGNOSIS — I77819 Aortic ectasia, unspecified site: Secondary | ICD-10-CM | POA: Insufficient documentation

## 2020-08-11 DIAGNOSIS — I480 Paroxysmal atrial fibrillation: Secondary | ICD-10-CM | POA: Diagnosis present

## 2020-08-11 DIAGNOSIS — I10 Essential (primary) hypertension: Secondary | ICD-10-CM | POA: Diagnosis not present

## 2020-08-11 DIAGNOSIS — I484 Atypical atrial flutter: Secondary | ICD-10-CM | POA: Diagnosis not present

## 2020-08-11 NOTE — Progress Notes (Signed)
Primary Care Physician: Lujean Amel, MD Primary Cardiologist: Dr Irish Lack  Primary Electrophysiologist: none Referring Physician: Zacarias Pontes ED/Dr Loraine Leriche Montellano is a 60 y.o. female with a history of PACs, hypotension, and atrial fibrillation who presents for follow up in the [facility]. The patient was initially diagnosed with atrial fibrillation and atrial flutter 07/02/20 after presenting to the ED with symptoms of palpitations, dizziness, and presyncope. EMS strips showed afib with rates 130s-180s. ECG on arrival to the ED showed atrial flutter with variable block. Patient was started on Eliquis for a CHADS2VASC score of 1 and diltiazem for rate control. She spontaneously converted to SR prior to leaving ED. She has had palpitations for years but cardiac monitoring only showed frequent PACs. Patient reports she never started Eliquis or diltiazem because of concerns about hypotension. She denies significant snoring or alcohol use. She has cut back her caffeine use.   On follow up today, patient reports she has done well since her last visit. She has had 3-4 brief episodes (< 1 minute) of heart racing, not long enough to take a PRN CCB. Heart monitor showed 2% AF burden with rapid rates. Echo showed EF 60-65%, no LAE.   Today, she denies symptoms of chest pain, shortness of breath, orthopnea, PND, lower extremity edema, dizziness, presyncope, syncope, snoring, daytime somnolence, bleeding, or neurologic sequela. The patient is tolerating medications without difficulties and is otherwise without complaint today.    Atrial Fibrillation Risk Factors:  she does not have symptoms or diagnosis of sleep apnea. she does not have a history of rheumatic fever. she does not have a history of alcohol use. The patient does not have a history of early familial atrial fibrillation or other arrhythmias.  she has a BMI of Body mass index is 21.79 kg/m.Marland Kitchen Filed  Weights   08/11/20 1437  Weight: 61.2 kg    Family History  Problem Relation Age of Onset  . Hypertension Mother   . Stroke Mother   . Arthritis Mother   . Osteoporosis Mother   . Other Mother        PRECANCEROUS ANAL LESION  . Colon polyps Father 44  . Other Father        RESISTANT H. PYLORI  . Colon cancer Neg Hx   . CAD Neg Hx   . Liver cancer Neg Hx   . Kidney cancer Neg Hx      Atrial Fibrillation Management history:  Previous antiarrhythmic drugs: none Previous cardioversions: none Previous ablations: none CHADS2VASC score: 1 Anticoagulation history: Eliquis   Past Medical History:  Diagnosis Date  . ADHD    [place]  ATTENTIONS SPECIALIST  . Allergic rhinitis   . Arrhythmia   . Blood in stool   . Fibroid, uterine   . Hemorrhoids   . Herpes   . Menorrhagia   . Palpitations   . Posterior vitreous detachment   . Rectal bleeding   . Syncope   . Viral meningitis    Past Surgical History:  Procedure Laterality Date  . CESAREAN SECTION  1991  . COLONOSCOPY    . TOTAL ABDOMINAL HYSTERECTOMY  2005    Current Outpatient Medications  Medication Sig Dispense Refill  . Cholecalciferol (VITAMIN D-3) 1000 UNITS CAPS Take 1,000 Units by mouth daily.     Marland Kitchen diltiazem (CARDIZEM) 30 MG tablet Take 1 tablet every 4 hours AS NEEDED for heart rate >100 as long as top blood pressure >100. 30 tablet  1  . EPIPEN 2-PAK 0.3 MG/0.3ML SOAJ injection Inject into the muscle once.     No current facility-administered medications for this encounter.    Allergies  Allergen Reactions  . Apple Nausea Only    Red skin apples Reaction: headaches, nausea Itching of throat  . Cephalosporins Hives  . Other Swelling    Bees , metal (nickel): causes swelling  . Penicillins Hives and Rash    Did it involve swelling of the face/tongue/throat, SOB, or low BP? no Did it involve sudden or severe rash/hives, skin peeling, or any reaction on the inside of your mouth or nose? yes Did  you need to seek medical attention at a hospital or doctor's office? no When did it last happen?40 years If all above answers are "NO", may proceed with cephalosporin use.    Social History   Socioeconomic History  . Marital status: Married    Spouse name: Not on file  . Number of children: 2  . Years of education: Not on file  . Highest education level: Not on file  Occupational History  . Occupation: HOME MAKER  Tobacco Use  . Smoking status: Never Smoker  . Smokeless tobacco: Never Used  Vaping Use  . Vaping Use: Never used  Substance and Sexual Activity  . Alcohol use: Yes    Alcohol/week: 1.0 standard drink    Types: 1 Glasses of wine per week  . Drug use: No  . Sexual activity: Not on file  Other Topics Concern  . Not on file  Social History Narrative  . Not on file   Social Determinants of Health   Financial Resource Strain: Not on file  Food Insecurity: Not on file  Transportation Needs: Not on file  Physical Activity: Not on file  Stress: Not on file  Social Connections: Not on file  Intimate Partner Violence: Not on file     ROS- All systems are reviewed and negative except as per the HPI above.  Physical Exam: Vitals:   08/11/20 1437  BP: 94/66  Pulse: 73  Weight: 61.2 kg  Height: 5\' 6"  (1.676 m)    GEN- The patient is well appearing, alert and oriented x 3 today.   HEENT-head normocephalic, atraumatic, sclera clear, conjunctiva pink, hearing intact, trachea midline. Lungs- Clear to ausculation bilaterally, normal work of breathing Heart- Regular rate and rhythm, no murmurs, rubs or gallops  GI- soft, NT, ND, + BS Extremities- no clubbing, cyanosis, or edema MS- no significant deformity or atrophy Skin- no rash or lesion Psych- euthymic mood, full affect Neuro- strength and sensation are intact   Wt Readings from Last 3 Encounters:  08/11/20 61.2 kg  07/09/20 60.1 kg  07/02/20 60.3 kg    EKG today demonstrates SR Vent. rate 73  BPM PR interval 134 ms QRS duration 80 ms QT/QTc 398/438 ms  Echo 07/21/20 demonstrated  1. Left ventricular ejection fraction, by estimation, is 60 to 65%. The  left ventricle has normal function. The left ventricle has no regional  wall motion abnormalities. Left ventricular diastolic parameters are  consistent with Grade I diastolic  dysfunction (impaired relaxation). The average left ventricular global  longitudinal strain is -26.5 %. The global longitudinal strain is normal.  2. Right ventricular systolic function is normal. The right ventricular  size is normal.  3. The mitral valve is normal in structure. No evidence of mitral valve  regurgitation. No evidence of mitral stenosis.  4. The aortic valve is normal in structure. Aortic  valve regurgitation is  trivial. No aortic stenosis is present.  5. Aortic dilatation noted. There is mild dilatation at the level of the  sinuses of Valsalva, measuring 41 mm. There is borderline dilatation of  the ascending aorta, measuring 38 mm.  6. The inferior vena cava is normal in size with greater than 50%  respiratory variability, suggesting right atrial pressure of 3 mmHg.   Epic records are reviewed at length today  CHA2DS2-VASc Score = 1  The patient's score is based upon: CHF History: No HTN History: No Diabetes History: No Stroke History: No Vascular Disease History: No Age Score: 0 Gender Score: 1      ASSESSMENT AND PLAN: 1. Paroxysmal atrial fibrillation/Atypical atrial flutter The patient's CHA2DS2-VASc score is 1, indicating a 0.6% annual risk of stroke.   We discussed therapeutic options today including AAD and ablation. She reports her BP at home runs [id number] systolic with a heart rate in low 50s. This limits rate control/AAD options. She may be a good candidate for a front-line ablation. She prefers to continue present therapy for now. No indication for anticoagulation at this time.  Continue diltiazem 30 mg PRN  q 4 hours for heart racing.  2. 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   Follow up in the AF clinic in 3 months.    [facility] [address] [place], [place] [address] [phone] 08/11/2020 2:44 PM

## 2020-09-25 ENCOUNTER — Encounter (HOSPITAL_COMMUNITY): Payer: Self-pay

## 2020-09-25 ENCOUNTER — Telehealth: Payer: Self-pay | Admitting: Physician Assistant

## 2020-09-25 ENCOUNTER — Emergency Department (HOSPITAL_COMMUNITY)
Admission: EM | Admit: 2020-09-25 | Discharge: 2020-09-25 | Disposition: A | Discharge status: Home / Self Care | Payer: BC Managed Care – PPO | Attending: Emergency Medicine | Admitting: Emergency Medicine

## 2020-09-25 ENCOUNTER — Other Ambulatory Visit: Payer: Self-pay

## 2020-09-25 DIAGNOSIS — R002 Palpitations: Secondary | ICD-10-CM | POA: Diagnosis present

## 2020-09-25 DIAGNOSIS — I4891 Unspecified atrial fibrillation: Secondary | ICD-10-CM | POA: Insufficient documentation

## 2020-09-25 DIAGNOSIS — Z79899 Other long term (current) drug therapy: Secondary | ICD-10-CM | POA: Diagnosis not present

## 2020-09-25 HISTORY — DX: Unspecified atrial fibrillation: I48.91

## 2020-09-25 LAB — BASIC METABOLIC PANEL
Anion gap: 10 (ref 5–15)
BUN: 13 mg/dL (ref 6–20)
CO2: 21 mmol/L — ABNORMAL LOW (ref 22–32)
Calcium: 9 mg/dL (ref 8.9–10.3)
Chloride: 111 mmol/L (ref 98–111)
Creatinine, Ser: 0.77 mg/dL (ref 0.44–1.00)
GFR, Estimated: >60 mL/min (ref >60)
Glucose, Bld: 101 mg/dL — ABNORMAL HIGH (ref 70–99)
Potassium: 3.6 mmol/L (ref 3.5–5.1)
Sodium: 142 mmol/L (ref 135–145)

## 2020-09-25 LAB — CBC
HCT: 42.1 % (ref 36.0–46.0)
Hemoglobin: 14.3 g/dL (ref 12.0–15.0)
MCH: 29.2 pg (ref 26.0–34.0)
MCHC: 34 g/dL (ref 30.0–36.0)
MCV: 85.9 fL (ref 80.0–100.0)
Platelets: 283 10*3/uL (ref 150–400)
RBC: 4.9 MIL/uL (ref 3.87–5.11)
RDW: 13.7 % (ref 11.5–15.5)
WBC: 8 10*3/uL (ref 4.0–10.5)
nRBC: 0 % (ref 0.0–0.2)

## 2020-09-25 MED ORDER — DILTIAZEM HCL 30 MG PO TABS
30.0000 mg | ORAL_TABLET | Freq: Once | ORAL | Status: AC
  Administered 2020-09-25: 30 mg via ORAL
  Filled 2020-09-25: qty 1

## 2020-09-25 NOTE — ED Notes (Signed)
Pt refusing iv medications, requesting only oral cardizem at this time.

## 2020-09-25 NOTE — ED Provider Notes (Signed)
[facility] EMERGENCY DEPARTMENT Provider Note   CSN: [id number] Arrival date & time: 09/25/20  2007     History Chief Complaint  Patient presents with  . Palpitations    Shelly Mahoney is a 60 y.o. female.  HPI 60 year old female with a history of paroxysmal A. fib/a flutter, ADHD, fibroids, hemorrhoids followed by Dr. Irish Lack with cardiology presents to the ER with complaints of palpitations and elevated heart rate which began at around 6 PM today.  Patient states that she was seen here in December with similar symptoms, was thought to be in a flutter/questionable A. fib.  She had required a Cardizem drip which did cause her to convert.  She is followed by the A. fib clinic, called the on-call cardiology provider whose note I have reviewed.  She states that she was told that her A. fib could be brought on by overeating.  States that she ate a lot of rice today and felt the episode coming on.  Symptoms began at around 6 PM.  She had taken 30 mg of her home Cardizem and had called EMS, which noted that her blood pressure was stable and her heart rate had improved from the 170s to the 140s.  On-call cardiology provider had recommended she take another 30 mg dose to see if her heart rate response, however EMS stated that they cannot wait an hour to wait for the medication to respond at this brought her to the ER for further management and observation.  She denies any dizziness, states she feels mildly short of breath but this may be due to her anxiety.  She is not on any anticoagulation.  She denies any chest pain nausea, vomit, abdominal pain, or any other associated symptoms.    Past Medical History:  Diagnosis Date  . A-fib ([place])   . ADHD    [place]  ATTENTIONS SPECIALIST  . Allergic rhinitis   . Arrhythmia   . Blood in stool   . Fibroid, uterine   . Hemorrhoids   . Herpes   . Menorrhagia   . Palpitations   . Posterior vitreous detachment   . Rectal bleeding   .  Syncope   . Viral meningitis     Patient Active Problem List   Diagnosis Date Noted  . Paroxysmal atrial fibrillation ([place]) 07/09/2020  . Atypical atrial flutter ([place]) 07/09/2020  . Heart palpitations 06/03/2013    Past Surgical History:  Procedure Laterality Date  . CESAREAN SECTION  1991  . COLONOSCOPY    . TOTAL ABDOMINAL HYSTERECTOMY  2005     OB History   No obstetric history on file.     Family History  Problem Relation Age of Onset  . Hypertension Mother   . Stroke Mother   . Arthritis Mother   . Osteoporosis Mother   . Other Mother        PRECANCEROUS ANAL LESION  . Colon polyps Father 72  . Other Father        RESISTANT H. PYLORI  . Colon cancer Neg Hx   . CAD Neg Hx   . Liver cancer Neg Hx   . Kidney cancer Neg Hx     Social History   Tobacco Use  . Smoking status: Never Smoker  . Smokeless tobacco: Never Used  Vaping Use  . Vaping Use: Never used  Substance Use Topics  . Alcohol use: Yes    Alcohol/week: 1.0 standard drink    Types: 1 Glasses of wine  per week  . Drug use: No    Home Medications Prior to Admission medications   Medication Sig Start Date End Date Taking? Authorizing Provider  Cholecalciferol (VITAMIN D-3) 1000 UNITS CAPS Take 1,000 Units by mouth daily.     [provider]  diltiazem (CARDIZEM) 30 MG tablet Take 1 tablet every 4 hours AS NEEDED for heart rate >100 as long as top blood pressure >100. 07/09/20   Fenton, Clint R, PA  EPIPEN 2-PAK 0.3 MG/0.3ML SOAJ injection Inject into the muscle once. 03/05/20   [provider]    Allergies    Apple, Cephalosporins, Other, and Penicillins  Review of Systems   Review of Systems  Constitutional: Negative for chills and fever.  HENT: Negative for ear pain and sore throat.   Eyes: Negative for pain and visual disturbance.  Respiratory: Positive for shortness of breath. Negative for cough.   Cardiovascular: Positive for palpitations. Negative for chest pain and  leg swelling.  Gastrointestinal: Negative for abdominal pain and vomiting.  Genitourinary: Negative for dysuria and hematuria.  Musculoskeletal: Negative for arthralgias and back pain.  Skin: Negative for color change and rash.  Neurological: Negative for seizures and syncope.  All other systems reviewed and are negative.   Physical Exam Updated Vital Signs BP 111/84 (BP Location: Right Arm)   Pulse 95   Temp 98.2 F (36.8 C) (Oral)   Resp 14   Ht 5\' 6"  (1.676 m)   Wt 61.2 kg   SpO2 99%   BMI 21.78 kg/m   Physical Exam Vitals and nursing note reviewed.  Constitutional:      General: She is not in acute distress.    Appearance: She is well-developed and well-nourished.  HENT:     Head: Normocephalic and atraumatic.  Eyes:     Conjunctiva/sclera: Conjunctivae normal.  Cardiovascular:     Rate and Rhythm: Tachycardia present. Rhythm irregular.     Heart sounds: No murmur heard.   Pulmonary:     Effort: Pulmonary effort is normal. No respiratory distress.     Breath sounds: Normal breath sounds.  Abdominal:     Palpations: Abdomen is soft.     Tenderness: There is no abdominal tenderness.  Musculoskeletal:        General: No edema.     Cervical back: Neck supple.  Skin:    General: Skin is warm and dry.  Neurological:     Mental Status: She is alert.  Psychiatric:        Mood and Affect: Mood and affect normal.     ED Results / Procedures / Treatments   Labs (all labs ordered are listed, but only abnormal results are displayed) Labs Reviewed  BASIC METABOLIC PANEL - Abnormal; Notable for the following components:      Result Value   CO2 21 (*)    Glucose, Bld 101 (*)    All other components within normal limits  CBC    EKG EKG Interpretation  Date/Time:  Friday September 25 2020 21:57:21 EST Ventricular Rate:  82 PR Interval:    QRS Duration: 84 QT Interval:  373 QTC Calculation: 436 R Axis:   72 Text Interpretation: Sinus rhythm no acute STEMI  Confirmed by Madalyn Rob [id number]) on 09/25/2020 10:45:23 PM   Radiology No results found.  Procedures Procedures   Medications Ordered in ED Medications  diltiazem (CARDIZEM) tablet 30 mg (30 mg Oral Given 09/25/20 2105)    ED Course  I have reviewed the triage  vital signs and the nursing notes.  Pertinent labs & imaging results that were available during my care of the patient were reviewed by me and considered in my medical decision making (see chart for details).  Clinical Course as of 09/25/20 2245  Fri Sep 26, 6059  8130 60 year old female presents to the ER with A. fib with RVR.  On arrival, she is overall well-appearing, speaking full sentences, in no acute distress.  She does appear to be in A. fib with RVR with a rate ranging from the 140s to the 190s.  She has some mild shortness of breath, but no other significant associated symptoms.  She is not anticoagulated.  Blood pressures stable at 97/77 [MB]  2054 Patient is adamant about trying another 30 mg of her Cardizem.  Chart reviewed, will attempt to give her this and reassess.  She may require Cardizem drip.  Basic labs pending [MB]  2117 CBC and BMP unremarkable.  Patient converted back to normal sinus rhythm, heart rate in the mid 90s.  Asymptomatic currently.  Repeat EKG confirming normal sinus rhythm.  Patient was encouraged to avoid eating heavy foods, discussed follow-up with cardiologist on Monday which she voiced understanding and is agreeable to.  Stable for discharge at this time.  This was a shared visit with my supervising physician Dr. Roslynn Amble who independently saw and evaluated the patient & provided guidance in evaluation/management/disposition ,in agreement with care  [MB]    Clinical Course User Index [MB] Lyndel Safe   MDM Rules/Calculators/A&P                            Final Clinical Impression(s) / ED Diagnoses Final diagnoses:  Atrial fibrillation with RVR [facility])    Rx / DC Orders ED  Discharge Orders    None       Lyndel Safe 09/25/20 2246    Lucrezia Starch, MD 09/28/20 1415

## 2020-09-25 NOTE — Discharge Instructions (Addendum)
Please follow-up with your cardiologist on Monday.  Return to the ER for any new or worsening symptoms.

## 2020-09-25 NOTE — Telephone Encounter (Signed)
Patient with h/o PAF CHA2DS-vasc score of 1, went into recurrent PAF around 6:30PM. Took a dose of 30mg  cardizem, HR improved from 170s down to 90s-120s. I spoke with EMS, her BP is good, HR is near upper border of normal, advised her to take another dose of cardizem, hopefully this will convert her back to sinus rhythm. If her HR is still high in a hour, she have been advised to contact us.

## 2020-09-25 NOTE — ED Triage Notes (Signed)
Per [facility] ems, pt coming from home reporting palpitations, pt took home 30mg  cardizem medication and when ems arrived HR 130S, but in route hr dropped back into 90s. Pt hx of anxiety, and afib.

## 2020-09-27 NOTE — Telephone Encounter (Signed)
[place],  FYI  JV

## 2020-09-28 ENCOUNTER — Telehealth (HOSPITAL_COMMUNITY): Payer: Self-pay

## 2020-09-28 NOTE — Telephone Encounter (Signed)
Reached out to patient to see if she needed to be seen sooner than schedule appointment since she was seen in the ED over the weekend. Patient states she feels fine and will keep appointment in April and will reach out to [place] if she need to be seen sooner.

## 2020-11-10 ENCOUNTER — Encounter (HOSPITAL_COMMUNITY): Payer: Self-pay

## 2020-11-10 ENCOUNTER — Ambulatory Visit (HOSPITAL_COMMUNITY): Payer: BC Managed Care – PPO | Admitting: Physician Assistant

## 2021-02-09 ENCOUNTER — Other Ambulatory Visit: Payer: Self-pay

## 2021-02-09 ENCOUNTER — Encounter (HOSPITAL_COMMUNITY): Payer: Self-pay

## 2021-02-09 ENCOUNTER — Emergency Department (HOSPITAL_COMMUNITY)
Admission: EM | Admit: 2021-02-09 | Discharge: 2021-02-09 | Disposition: A | Discharge status: Home / Self Care | Payer: BC Managed Care – PPO | Attending: Emergency Medicine | Admitting: Emergency Medicine

## 2021-02-09 DIAGNOSIS — E876 Hypokalemia: Secondary | ICD-10-CM

## 2021-02-09 DIAGNOSIS — I480 Paroxysmal atrial fibrillation: Secondary | ICD-10-CM | POA: Insufficient documentation

## 2021-02-09 LAB — CBC WITH DIFFERENTIAL/PLATELET
Abs Immature Granulocytes: 0.02 10*3/uL (ref 0.00–0.07)
Basophils Absolute: 0.1 10*3/uL (ref 0.0–0.1)
Basophils Relative: 1 %
Eosinophils Absolute: 0 10*3/uL (ref 0.0–0.5)
Eosinophils Relative: 0 %
HCT: 45.1 % (ref 36.0–46.0)
Hemoglobin: 15.3 g/dL — ABNORMAL HIGH (ref 12.0–15.0)
Immature Granulocytes: 0 %
Lymphocytes Relative: 29 %
Lymphs Abs: 2 10*3/uL (ref 0.7–4.0)
MCH: 28.9 pg (ref 26.0–34.0)
MCHC: 33.9 g/dL (ref 30.0–36.0)
MCV: 85.1 fL (ref 80.0–100.0)
Monocytes Absolute: 0.4 10*3/uL (ref 0.1–1.0)
Monocytes Relative: 6 %
Neutro Abs: 4.3 10*3/uL (ref 1.7–7.7)
Neutrophils Relative %: 64 %
Platelets: 259 10*3/uL (ref 150–400)
RBC: 5.3 MIL/uL — ABNORMAL HIGH (ref 3.87–5.11)
RDW: 13.5 % (ref 11.5–15.5)
WBC: 6.8 10*3/uL (ref 4.0–10.5)
nRBC: 0 % (ref 0.0–0.2)

## 2021-02-09 LAB — BASIC METABOLIC PANEL
Anion gap: 16 — ABNORMAL HIGH (ref 5–15)
BUN: 13 mg/dL (ref 8–23)
CO2: 18 mmol/L — ABNORMAL LOW (ref 22–32)
Calcium: 9.7 mg/dL (ref 8.9–10.3)
Chloride: 103 mmol/L (ref 98–111)
Creatinine, Ser: 0.99 mg/dL (ref 0.44–1.00)
GFR, Estimated: >60 mL/min (ref >60)
Glucose, Bld: 124 mg/dL — ABNORMAL HIGH (ref 70–99)
Potassium: 3.4 mmol/L — ABNORMAL LOW (ref 3.5–5.1)
Sodium: 137 mmol/L (ref 135–145)

## 2021-02-09 LAB — MAGNESIUM: Magnesium: 1.8 mg/dL (ref 1.7–2.4)

## 2021-02-09 NOTE — ED Notes (Signed)
Pt eating saltine crackers & drinking water.

## 2021-02-09 NOTE — ED Provider Notes (Signed)
[place] EMERGENCY DEPARTMENT Provider Note   CSN: [id number] Arrival date & time: 02/09/21  1527     History Chief Complaint  Patient presents with   Irregular Heart Beat    Shelly Mahoney is a 60 y.o. female.  The history is provided by the patient, the EMS personnel and medical records.  Shelly Mahoney is a 60 y.o. female who presents to the Emergency Department complaining of a fib. She presents the emergency department by EMS for evaluation of a fib. She has a history of paroxysmal a fib. This morning she did not eat breakfast and drink 3 cups of coffee, which is more than her usual. She was at [facility] when she felt palpitations and rapid heartbeat. She took one of her PRN 30 mg diltiazem's at 2 PM. She normally has improvement in her symptoms within 30 minutes to an hour. She had ongoing symptoms with heart rates up to 190s and EMS was called. She reports feeling tingling from her feet to her chest and some chest discomfort. On EMS arrival her heart rate was ranging from 100 to 190 with a fib with RVR. At time of ED arrival she states that the tingling is starting to improve. She is not currently on anticoagulation. Symptoms are severe, constant, improving. No reports of recent illnesses. No fevers, vomiting, diarrhea.    Past Medical History:  Diagnosis Date   A-fib [facility])    ADHD    [place]  ATTENTIONS SPECIALIST   Allergic rhinitis    Arrhythmia    Blood in stool    Fibroid, uterine    Hemorrhoids    Herpes    Menorrhagia    Palpitations    Posterior vitreous detachment    Rectal bleeding    Syncope    Viral meningitis     Patient Active Problem List   Diagnosis Date Noted   Paroxysmal atrial fibrillation ([place]) 07/09/2020   Atypical atrial flutter ([place]) 07/09/2020   Heart palpitations 06/03/2013    Past Surgical History:  Procedure Laterality Date   CESAREAN SECTION  1991   COLONOSCOPY     TOTAL ABDOMINAL HYSTERECTOMY  2005     OB  History   No obstetric history on file.     Family History  Problem Relation Age of Onset   Hypertension Mother    Stroke Mother    Arthritis Mother    Osteoporosis Mother    Other Mother        PRECANCEROUS ANAL LESION   Colon polyps Father 19   Other Father        RESISTANT H. PYLORI   Colon cancer Neg Hx    CAD Neg Hx    Liver cancer Neg Hx    Kidney cancer Neg Hx     Social History   Tobacco Use   Smoking status: Never   Smokeless tobacco: Never  Vaping Use   Vaping Use: Never used  Substance Use Topics   Alcohol use: Yes    Alcohol/week: 1.0 standard drink    Types: 1 Glasses of wine per week   Drug use: No    Home Medications Prior to Admission medications   Medication Sig Start Date End Date Taking? Authorizing Provider  Cholecalciferol (VITAMIN D-3) 1000 UNITS CAPS Take 1,000 Units by mouth daily.    Yes [provider]  diltiazem (CARDIZEM) 30 MG tablet Take 1 tablet every 4 hours AS NEEDED for heart rate >100 as long as top blood pressure >  100. 07/09/20  Yes Fenton, Clint R, PA  EPIPEN 2-PAK 0.3 MG/0.3ML SOAJ injection Inject 0.3 mg into the muscle as needed for anaphylaxis. 03/05/20  Yes [provider]    Allergies    Apple, Cephalosporins, Other, and Penicillins  Review of Systems   Review of Systems  All other systems reviewed and are negative.  Physical Exam Updated Vital Signs BP 105/73   Pulse 85   Temp 98.5 F (36.9 C) (Oral)   Resp 15   Ht 5\' 6"  (1.676 m)   Wt 61.2 kg   SpO2 98%   BMI 21.79 kg/m   Physical Exam Vitals and nursing note reviewed.  Constitutional:      Appearance: She is well-developed.  HENT:     Head: Normocephalic and atraumatic.  Cardiovascular:     Rate and Rhythm: Regular rhythm. Tachycardia present.     Heart sounds: No murmur heard. Pulmonary:     Effort: Pulmonary effort is normal. No respiratory distress.     Breath sounds: Normal breath sounds.  Abdominal:     Palpations: Abdomen  is soft.     Tenderness: There is no abdominal tenderness. There is no guarding or rebound.  Musculoskeletal:        General: No swelling or tenderness.  Skin:    General: Skin is warm and dry.  Neurological:     Mental Status: She is alert and oriented to person, place, and time.  Psychiatric:        Behavior: Behavior normal.    ED Results / Procedures / Treatments   Labs (all labs ordered are listed, but only abnormal results are displayed) Labs Reviewed  BASIC METABOLIC PANEL - Abnormal; Notable for the following components:      Result Value   Potassium 3.4 (*)    CO2 18 (*)    Glucose, Bld 124 (*)    Anion gap 16 (*)    All other components within normal limits  CBC WITH DIFFERENTIAL/PLATELET - Abnormal; Notable for the following components:   RBC 5.30 (*)    Hemoglobin 15.3 (*)    All other components within normal limits  MAGNESIUM    EKG EKG Interpretation  Date/Time:  Tuesday February 09 2021 15:32:20 EDT Ventricular Rate:  104 PR Interval:  160 QRS Duration: 90 QT Interval:  373 QTC Calculation: 493 R Axis:   63 Text Interpretation: Sinus tachycardia Consider right atrial enlargement Borderline prolonged QT interval Confirmed by Quintella Reichert [phone]) on 02/09/2021 4:13:51 PM  Radiology No results found.  Procedures Procedures   Medications Ordered in ED Medications - No data to display  ED Course  I have reviewed the triage vital signs and the nursing notes.  Pertinent labs & imaging results that were available during my care of the patient were reviewed by me and considered in my medical decision making (see chart for details).    MDM Rules/Calculators/A&P                          patient with history of paroxysmal a fib here for evaluation following episode of a fib. She did take her Cardizem prior to ED arrival and self converted to sinus rhythm on ED presentation. Labs with mild hypokalemia. She also has mild decrease in her bicarb, mildly  elevated anion gap that she has been fasting today. Hemoglobin is also mildly elevated today - these are consistent with fasting and dehydration. She is asymptomatic on reassessment.  Plan to discharge home with outpatient follow-up with a fib clinic. Return precautions discussed.  CHA2DS2/VAS Stroke Risk Points  Current as of 4 hours ago     2 >= 2 Points: High Risk  1 - 1.99 Points: Medium Risk  0 Points: Low Risk    No Change      Details    This score determines the patient's risk of having a stroke if the  patient has atrial fibrillation.       Points Metrics  0 Has Congestive Heart Failure:  No    Current as of 4 hours ago  0 Has Vascular Disease:  No    Current as of 4 hours ago  1 Has Hypertension:  Yes     Current as of 4 hours ago  0 Age:  31    Current as of 4 hours ago  0 Has Diabetes:  No    Current as of 4 hours ago  0 Had Stroke:  No  Had TIA:  No  Had Thromboembolism:  No    Current as of 4 hours ago  1 Female:  Yes    Current as of 4 hours ago            Final Clinical Impression(s) / ED Diagnoses Final diagnoses:  Paroxysmal atrial fibrillation ([place])  Hypokalemia    Rx / DC Orders ED Discharge Orders          Ordered    Amb Referral to AFIB Clinic        02/09/21 1807             Quintella Reichert, MD 02/09/21 2246

## 2021-02-09 NOTE — ED Triage Notes (Signed)
Pt was at [facility] and felt her heart racing and dizziness. Pt took 30mg  of her cardizem and waited 30 min and called EMS. Upon EMS arrival pt heartrate 180.

## 2021-02-15 ENCOUNTER — Ambulatory Visit (HOSPITAL_COMMUNITY): Payer: BC Managed Care – PPO | Admitting: Physician Assistant

## 2021-06-29 ENCOUNTER — Other Ambulatory Visit (HOSPITAL_COMMUNITY): Payer: BC Managed Care – PPO

## 2021-07-15 ENCOUNTER — Other Ambulatory Visit (HOSPITAL_COMMUNITY): Payer: BC Managed Care – PPO

## 2021-07-15 ENCOUNTER — Telehealth: Payer: Self-pay | Admitting: Interventional Cardiology

## 2021-07-15 DIAGNOSIS — I77819 Aortic ectasia, unspecified site: Secondary | ICD-10-CM

## 2021-07-15 DIAGNOSIS — I480 Paroxysmal atrial fibrillation: Secondary | ICD-10-CM

## 2021-07-15 NOTE — Telephone Encounter (Signed)
I need echo order to be extended past the 12/28 expire date, so I can reschedule her for January.  Thank you.

## 2021-07-15 NOTE — Telephone Encounter (Signed)
New order placed

## 2021-08-02 ENCOUNTER — Other Ambulatory Visit: Payer: Self-pay

## 2021-08-02 ENCOUNTER — Ambulatory Visit (HOSPITAL_COMMUNITY): Payer: BC Managed Care – PPO | Attending: Cardiovascular Disease

## 2021-08-02 DIAGNOSIS — I77819 Aortic ectasia, unspecified site: Secondary | ICD-10-CM | POA: Insufficient documentation

## 2021-08-02 DIAGNOSIS — I480 Paroxysmal atrial fibrillation: Secondary | ICD-10-CM

## 2021-08-02 LAB — ECHOCARDIOGRAM COMPLETE
Area-P 1/2: 3.37 cm2
S' Lateral: 2.55 cm

## 2021-08-03 NOTE — Progress Notes (Deleted)
Cardiology Office Note   Date:  08/03/2021   ID:  Shelly Mahoney, DOB [birth year], MRN [id number]  PCP:  Lujean Amel, MD    No chief complaint on file.  AFib  Wt Readings from Last 3 Encounters:  02/09/21 135 lb (61.2 kg)  09/25/20 134 lb 14.7 oz (61.2 kg)  08/11/20 135 lb (61.2 kg)       History of Present Illness: Shelly Mahoney is a 61 y.o. female    Who has had palpitations in the past.  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   She had a Holter monitor in 2014 and this was benign per her report.  Reported PACs.    Records reviewed showed: "history of uterine fibroids and severe menorrhagia about 10 years ago and was told that she had a heart murmur from her anemia. She had a hysterectomy which resolved her anemia.."   She had a syncopal episode in 2014.   Seen in 2019.  Had sx of isolated premature beats.  Exercise was also recommended.    She also had atypical chest pain in 2019.  We did not do any testing at that time.  She was going to let us know if symptoms got worse or became more typical.   Over the past few months, she is having more PACs.  Sudden onset of pounding in her chest.  No triggers.  She felt pulse was irregular. Worse at night when she is relaxing.  Sx are lasting longer than usual.    On 11/15, she had some heartburn after eating.  She felt the palpitations and went to the ER. Initially, there was a concern for AFib.  She was given IV cardizem.  ECG was benign after this.     She was given oral cardizem, but her BP was low at home so she stopped it.   In 12/21: "The patient was initially diagnosed with atrial fibrillation and atrial flutter 07/02/20 after presenting to the ED with symptoms of palpitations, dizziness, and presyncope. EMS strips showed afib with rates 130s-180s. ECG on arrival to the ED showed atrial flutter with variable block. Patient was started on Eliquis for a CHADS2VASC score of 1 and diltiazem for rate control. She  spontaneously converted to SR prior to leaving ED. "   Past Medical History:  Diagnosis Date   A-fib [facility])    ADHD    [place]  ATTENTIONS SPECIALIST   Allergic rhinitis    Arrhythmia    Blood in stool    Fibroid, uterine    Hemorrhoids    Herpes    Menorrhagia    Palpitations    Posterior vitreous detachment    Rectal bleeding    Syncope    Viral meningitis     Past Surgical History:  Procedure Laterality Date   CESAREAN SECTION  1991   COLONOSCOPY     TOTAL ABDOMINAL HYSTERECTOMY  2005     Current Outpatient Medications  Medication Sig Dispense Refill   Cholecalciferol (VITAMIN D-3) 1000 UNITS CAPS Take 1,000 Units by mouth daily.      diltiazem (CARDIZEM) 30 MG tablet Take 1 tablet every 4 hours AS NEEDED for heart rate >100 as long as top blood pressure >100. 30 tablet 1   EPIPEN 2-PAK 0.3 MG/0.3ML SOAJ injection Inject 0.3 mg into the muscle as needed for anaphylaxis.     No current facility-administered medications for this visit.    Allergies:   Apple, Cephalosporins, Other, and  Penicillins    Social History:  The patient  reports that she has never smoked. She has never used smokeless tobacco. She reports current alcohol use of about 1.0 standard drink per week. She reports that she does not use drugs.   Family History:  The patient's ***family history includes Arthritis in her mother; Colon polyps (age of onset: 54) in her father; Hypertension in her mother; Osteoporosis in her mother; Other in her father and mother; Stroke in her mother.    ROS:  Please see the history of present illness.   Otherwise, review of systems are positive for ***.   All other systems are reviewed and negative.    PHYSICAL EXAM: VS:  There were no vitals taken for this visit. , BMI There is no height or weight on file to calculate BMI. GEN: Well nourished, well developed, in no acute distress HEENT: normal Neck: no JVD, carotid bruits, or masses Cardiac: ***RRR; no murmurs,  rubs, or gallops,no edema  Respiratory:  clear to auscultation bilaterally, normal work of breathing GI: soft, nontender, nondistended, + BS MS: no deformity or atrophy Skin: warm and dry, no rash Neuro:  Strength and sensation are intact Psych: euthymic mood, full affect   EKG:   The ekg ordered today demonstrates ***   Recent Labs: 02/09/2021: BUN 13; Creatinine, Ser 0.99; Hemoglobin 15.3; Magnesium 1.8; Platelets 259; Potassium 3.4; Sodium 137   Lipid Panel No results found for: CHOL, TRIG, HDL, CHOLHDL, VLDL, LDLCALC, LDLDIRECT   Other studies Reviewed: Additional studies/ records that were reviewed today with results demonstrating: ***.   ASSESSMENT AND PLAN:  Atrial flutter:  Anticoagulated: Previously took Eliquis, but this was stopped due to low CHADs-vasc score.  PACs Hypotension: occurred in the past with diltiazem.     Current medicines are reviewed at length with the patient today.  The patient concerns regarding her medicines were addressed.  The following changes have been made:  No change***  Labs/ tests ordered today include: *** No orders of the defined types were placed in this encounter.   Recommend 150 minutes/week of aerobic exercise Low fat, low carb, high fiber diet recommended  Disposition:   FU in ***   Signed, Larae Grooms, MD  08/03/2021 10:16 AM    [facility] [address], [place], [place]  [address] Phone: [phone]; Fax: [phone]

## 2021-08-03 NOTE — Progress Notes (Signed)
Pt has been made aware of normal result and verbalized understanding.  jw  Pt was concerned that he was having some "A-fib episodes" and wanted to see Dr. Irish Lack sooner, scheduled her to see him Friday, 08/06/21 at 9:00. Pt was very appreciative.

## 2021-08-06 ENCOUNTER — Ambulatory Visit: Payer: BC Managed Care – PPO | Admitting: Interventional Cardiology

## 2021-10-04 NOTE — Progress Notes (Deleted)
?  ?Cardiology Office Note ? ? ?Date:  10/04/2021  ? ?ID:  Shelly Mahoney, DOB 1960/09/27, MRN [id number] ? ?PCP:  Lujean Amel, MD  ? ? ?No chief complaint on file. ? ?PAF ? ?Wt Readings from Last 3 Encounters:  ?02/09/21 135 lb (61.2 kg)  ?09/25/20 134 lb 14.7 oz (61.2 kg)  ?08/11/20 135 lb (61.2 kg)  ?  ? ?  ?History of Present Illness: ?Shelly Mahoney is a 61 y.o. female  Who has had palpitations in the past.  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 ?  ?She had a Holter monitor in 2014 and this was benign per her report.  Reported PACs.  ?  ?Records reviewed showed: "history of uterine fibroids and severe menorrhagia about 10 years ago and was told that she had a heart murmur from her anemia. She had a hysterectomy which resolved her anemia.." ?  ?She had a syncopal episode in 2014. ?  ?Seen in 2019.  Had sx of isolated premature beats.  Exercise was also recommended.  ?  ?She also had atypical chest pain in 2019.  We did not do any testing at that time.  She was going to let us know if symptoms got worse or became more typical. ?  ?On 11/15,/21 she had some heartburn after eating.  She felt the palpitations and went to the ER. Initially, there was a concern for AFib.  She was given IV cardizem.  ECG was benign after this.   ?  ?She was given oral cardizem, but her BP was low at home so she stopped it. ? ? ? ?Past Medical History:  ?Diagnosis Date  ? A-fib ([place])   ? ADHD   ? [place]  ATTENTIONS SPECIALIST  ? Allergic rhinitis   ? Arrhythmia   ? Blood in stool   ? Fibroid, uterine   ? Hemorrhoids   ? Herpes   ? Menorrhagia   ? Palpitations   ? Posterior vitreous detachment   ? Rectal bleeding   ? Syncope   ? Viral meningitis   ? ? ?Past Surgical History:  ?Procedure Laterality Date  ? [place]  ? COLONOSCOPY    ? TOTAL ABDOMINAL HYSTERECTOMY  2005  ? ? ? ?Current Outpatient Medications  ?Medication Sig Dispense Refill  ? Cholecalciferol (VITAMIN D-3) 1000 UNITS CAPS Take 1,000 Units by mouth  daily.     ? diltiazem (CARDIZEM) 30 MG tablet Take 1 tablet every 4 hours AS NEEDED for heart rate >100 as long as top blood pressure >100. 30 tablet 1  ? EPIPEN 2-PAK 0.3 MG/0.3ML SOAJ injection Inject 0.3 mg into the muscle as needed for anaphylaxis.    ? ?No current facility-administered medications for this visit.  ? ? ?Allergies:   Apple juice, Cephalosporins, Other, and Penicillins  ? ? ?Social History:  The patient  reports that she has never smoked. She has never used smokeless tobacco. She reports current alcohol use of about 1.0 standard drink per week. She reports that she does not use drugs.  ? ?Family History:  The patient's ***family history includes Arthritis in her mother; Colon polyps (age of onset: 61) in her father; Hypertension in her mother; Osteoporosis in her mother; Other in her father and mother; Stroke in her mother.  ? ? ?ROS:  Please see the history of present illness.   Otherwise, review of systems are positive for ***.   All other systems are reviewed and negative.  ? ? ?  PHYSICAL EXAM: ?VS:  There were no vitals taken for this visit. , BMI There is no height or weight on file to calculate BMI. ?GEN: Well nourished, well developed, in no acute distress ?HEENT: normal ?Neck: no JVD, carotid bruits, or masses ?Cardiac: ***RRR; no murmurs, rubs, or gallops,no edema  ?Respiratory:  clear to auscultation bilaterally, normal work of breathing ?GI: soft, nontender, nondistended, + BS ?MS: no deformity or atrophy ?Skin: warm and dry, no rash ?Neuro:  Strength and sensation are intact ?Psych: euthymic mood, full affect ? ? ?EKG:   ?The ekg ordered today demonstrates *** ? ? ?Recent Labs: ?02/09/2021: BUN 13; Creatinine, Ser 0.99; Hemoglobin 15.3; Magnesium 1.8; Platelets 259; Potassium 3.4; Sodium 137  ? ?Lipid Panel ?No results found for: CHOL, TRIG, HDL, CHOLHDL, VLDL, LDLCALC, LDLDIRECT ?  ?Other studies Reviewed: ?Additional studies/ records that were reviewed today with results  demonstrating: ***. ? ? ?ASSESSMENT AND PLAN: ? ?PAF:  ?Hypotension: ?PACs: ?Aortic dilatation: ? ? ?Current medicines are reviewed at length with the patient today.  The patient concerns regarding her medicines were addressed. ? ?The following changes have been made:  No change*** ? ?Labs/ tests ordered today include: *** ?No orders of the defined types were placed in this encounter. ? ? ?Recommend 150 minutes/week of aerobic exercise ?Low fat, low carb, high fiber diet recommended ? ?Disposition:   FU in *** ? ? ?Signed, ?Larae Grooms, MD  ?10/04/2021 10:36 AM    ?[facility] ?[address], [place], [place]  [address] ?Phone: [phone]; Fax: [phone]  ? ?

## 2021-10-06 ENCOUNTER — Ambulatory Visit: Payer: BC Managed Care – PPO | Admitting: Interventional Cardiology

## 2021-10-06 DIAGNOSIS — I77819 Aortic ectasia, unspecified site: Secondary | ICD-10-CM

## 2021-10-06 DIAGNOSIS — I480 Paroxysmal atrial fibrillation: Secondary | ICD-10-CM

## 2021-10-06 DIAGNOSIS — I491 Atrial premature depolarization: Secondary | ICD-10-CM

## 2021-11-17 ENCOUNTER — Ambulatory Visit: Payer: BC Managed Care – PPO | Admitting: Interventional Cardiology

## 2021-12-13 ENCOUNTER — Ambulatory Visit: Payer: BC Managed Care – PPO | Admitting: Physician Assistant

## 2021-12-14 NOTE — Progress Notes (Unsigned)
Cardiology Office Note    Date:  12/14/2021   ID:  Shelly Mahoney, DOB 1960/08/18, MRN [id number]  PCP:  Shelly Amel, MD  Cardiologist:  Larae Grooms, MD  Electrophysiologist:  None   Chief Complaint: ***  History of Present Illness:   Shelly Mahoney is a 61 y.o. female with history of remote syncope 2014, atypical chest pain 2019 (testing not pursued at that time), PACs, intermittent PAF by monitor 2021, allergic rhinitis, uterine fibroids s/p hysterectomy, ADHD, viral meningitis, baseline hypotension who is seen for follow-up.   She has a remote history of palpitations with Holter in 2014 patient reported was benign. She also previous reported being diagnosed with a heart murmur when she was profoundly anemic in the setting of uterine fibroids/menorrhagia requiring hysterectomy. This resolved her anemia. She was seen in the ED for palpitations in 10/2019 but was in NSR by the time of evaluation. In 05/2020 she was seen in the ED for palpitations. Initially there was concern for atrial fib. She was given IV Cardizem. Initial EKG was a poor quality tracing in the first half of the EKG with possible irregularity, then appeared to show NSR with PAC followed by compensatory pause when more clearly defined in the latter half of the tracing. F/u EKG after that was also NSR. She was started on oral diltiazem but had low BP with this so stopped it. A follow-up monitor did demonstrate bursts of atrial fib so she was started on anticoagulation and referred to the afib clinic, last OV there 07/2020. Given overall low burden of AF they did not feel she needed antiarrhythmic therapy at that time and recommended PRN diltiazem. She did have 2 interim ER visits 09/2020 and 01/2021 with breakthrough symptoms. Last echo 07/2021 with mild dilation of ascending aorta, EF 60%, no significant valve disease.  Cta of aortic dilation Lytes Whos got lipids  Paroxysmal atrial fibrillation  Premature atrial  contractions Hypotension (baseline) Aortic dilatation  Labwork independently reviewed: 01/2021 Mg 1.8, Hgb 15.3, plt 259, K 3.4, Cr 0.99 10/2019 TSH wnl   Cardiology Studies:   Studies reviewed are outlined and summarized above. Reports included below if pertinent.   2D echo 07/2021   1. Aortic dilatation noted. There is mild dilatation of the ascending  aorta, measuring 38 mm and based on age, gender, and body surface area.   2. Left ventricular ejection fraction, by estimation, is 60 to 65%. Left  ventricular ejection fraction by 3D volume is 60 %. The left ventricle has  normal function. The left ventricle has no regional wall motion  abnormalities. Left ventricular diastolic   parameters were normal.   3. The mitral valve is normal in structure. Trivial mitral valve  regurgitation. No evidence of mitral stenosis.   4. The aortic valve is tricuspid. Aortic valve regurgitation is not  visualized. No aortic stenosis is present.   5. Right ventricular systolic function is normal. The right ventricular  size is normal. Tricuspid regurgitation signal is inadequate for assessing  PA pressure.   Monitor 06/2020 Intermittent Atrial fibrillation with rapid ventricular rates. Predominantly NSR.   Currently being followed in AFib clinic.  Consider flecainide and metoprolol.  If rhythm not controlled, would refer to EP for consideration of ablation.     Past Medical History:  Diagnosis Date   A-fib [facility])    ADHD    [place]  ATTENTIONS SPECIALIST   Allergic rhinitis    Arrhythmia    Blood in stool    Fibroid,  uterine    Hemorrhoids    Herpes    Menorrhagia    Palpitations    Posterior vitreous detachment    Rectal bleeding    Syncope    Viral meningitis     Past Surgical History:  Procedure Laterality Date   CESAREAN SECTION  1991   COLONOSCOPY     TOTAL ABDOMINAL HYSTERECTOMY  2005    Current Medications: No outpatient medications have been marked as taking for  the 12/15/21 encounter (Appointment) with [name], PA-C.   ***   Allergies:   Apple juice, Cephalosporins, Other, and Penicillins   Social History   Socioeconomic History   Marital status: Married    Spouse name: Not on file   Number of children: 2   Years of education: Not on file   Highest education level: Not on file  Occupational History   Occupation: HOME MAKER  Tobacco Use   Smoking status: Never   Smokeless tobacco: Never  Vaping Use   Vaping Use: Never used  Substance and Sexual Activity   Alcohol use: Yes    Alcohol/week: 1.0 standard drink    Types: 1 Glasses of wine per week   Drug use: No   Sexual activity: Not on file  Other Topics Concern   Not on file  Social History Narrative   Not on file   Social Determinants of Health   Financial Resource Strain: Not on file  Food Insecurity: Not on file  Transportation Needs: Not on file  Physical Activity: Not on file  Stress: Not on file  Social Connections: Not on file     Family History:  The patient's ***family history includes Arthritis in her mother; Colon polyps (age of onset: 55) in her father; Hypertension in her mother; Osteoporosis in her mother; Other in her father and mother; Stroke in her mother. There is no history of Colon cancer, CAD, Liver cancer, or Kidney cancer.  ROS:   Please see the history of present illness. Otherwise, review of systems is positive for ***.  All other systems are reviewed and otherwise negative.    EKG(s)/Additional Labs   EKG:  EKG is ordered today, personally reviewed, demonstrating ***  Recent Labs: 02/09/2021: BUN 13; Creatinine, Ser 0.99; Hemoglobin 15.3; Magnesium 1.8; Platelets 259; Potassium 3.4; Sodium 137  Recent Lipid Panel No results found for: CHOL, TRIG, HDL, CHOLHDL, VLDL, LDLCALC, LDLDIRECT  PHYSICAL EXAM:    VS:  There were no vitals taken for this visit.  BMI: There is no height or weight on file to calculate BMI.  GEN: Well nourished,  well developed female in no acute distress HEENT: normocephalic, atraumatic Neck: no JVD, carotid bruits, or masses Cardiac: ***RRR; no murmurs, rubs, or gallops, no edema  Respiratory:  clear to auscultation bilaterally, normal work of breathing GI: soft, nontender, nondistended, + BS MS: no deformity or atrophy Skin: warm and dry, no rash Neuro:  Alert and Oriented x 3, Strength and sensation are intact, follows commands Psych: euthymic mood, full affect  Wt Readings from Last 3 Encounters:  02/09/21 135 lb (61.2 kg)  09/25/20 134 lb 14.7 oz (61.2 kg)  08/11/20 135 lb (61.2 kg)     ASSESSMENT & PLAN:   ***     Disposition: F/u with ***   Medication Adjustments/Labs and Tests Ordered: Current medicines are reviewed at length with the patient today.  Concerns regarding medicines are outlined above. Medication changes, Labs and Tests ordered today are summarized above and listed  in the Patient Instructions accessible in Encounters.   Signed, [name], PA-C  12/14/2021 9:04 AM    [place] Phone: [phone]; Fax: [phone]

## 2021-12-15 ENCOUNTER — Ambulatory Visit: Payer: BC Managed Care – PPO | Admitting: Physician Assistant

## 2022-01-26 ENCOUNTER — Ambulatory Visit: Payer: BC Managed Care – PPO | Admitting: Physician Assistant

## 2022-02-21 ENCOUNTER — Ambulatory Visit: Payer: BC Managed Care – PPO | Admitting: Physician Assistant

## 2022-02-21 DIAGNOSIS — I480 Paroxysmal atrial fibrillation: Secondary | ICD-10-CM

## 2022-03-16 ENCOUNTER — Emergency Department (HOSPITAL_COMMUNITY)
Admission: EM | Admit: 2022-03-16 | Discharge: 2022-03-16 | Disposition: A | Discharge status: Home / Self Care | Payer: BC Managed Care – PPO | Attending: Emergency Medicine | Admitting: Emergency Medicine

## 2022-03-16 ENCOUNTER — Other Ambulatory Visit: Payer: Self-pay

## 2022-03-16 ENCOUNTER — Encounter (HOSPITAL_COMMUNITY): Payer: Self-pay

## 2022-03-16 DIAGNOSIS — U071 COVID-19: Secondary | ICD-10-CM | POA: Diagnosis not present

## 2022-03-16 DIAGNOSIS — R000 Tachycardia, unspecified: Secondary | ICD-10-CM

## 2022-03-16 DIAGNOSIS — R0602 Shortness of breath: Secondary | ICD-10-CM | POA: Diagnosis present

## 2022-03-16 LAB — SARS CORONAVIRUS 2 BY RT PCR: SARS Coronavirus 2 by RT PCR: POSITIVE — AB

## 2022-03-16 MED ORDER — DILTIAZEM HCL 30 MG PO TABS: 30.0000 mg | ORAL_TABLET | Freq: Once | ORAL | Status: DC

## 2022-03-16 MED ORDER — ACETAMINOPHEN 325 MG PO TABS
650.0000 mg | ORAL_TABLET | Freq: Once | ORAL | Status: AC
  Administered 2022-03-16: 650 mg via ORAL
  Filled 2022-03-16: qty 2

## 2022-03-16 MED ORDER — MOLNUPIRAVIR EUA 200MG CAPSULE: 4.0000 | ORAL_CAPSULE | Freq: Two times a day (BID) | ORAL | 0 refills | Status: AC

## 2022-03-16 NOTE — ED Triage Notes (Signed)
Pt reports SHOB, palpitations, and fatigue over the past few days. Pt reports her husband has COVID and she was coming here to get tested for COVID. Pt reports hx of Afib. Pt took 2 doses of PRN Cardizem earlier today due to [facility] and tachycardia. Pt HR 173 in triage.

## 2022-03-16 NOTE — ED Provider Notes (Signed)
[facility] DEPT Provider Note   CSN: [id number] Arrival date & time: 03/16/22  1609     History  Chief Complaint  Patient presents with   Palpitations   Shortness of Breath    Shelly Mahoney is a 61 y.o. female.   Palpitations Associated symptoms: shortness of breath   Shortness of Breath  Patient has a history of paroxysmal atrial fibrillation, syncope, palpitations, menorrhagia.  Patient states she came to the ED to have a COVID test.  Patient states her husband was recently diagnosed with COVID.  Patient has only had 2 vaccinations.  She is concerned and wanted to get tested.  She did a home test that was negative but it was the PCR test.  On arrival patient was noted to be very tachycardic.  Her heart rate was in the 170s.  Patient was immediately brought to the resuscitation room.  As I was speaking with her at the bedside her heart rate was down to 120s and it was now 110.  Patient was being placed on monitors were planning on doing laboratory tests and treating with IV medications.  Patient indicated she did not want any of this done.  She has trouble with her heart rate.  Usually she takes 30 mg of Cardizem orally.    Home Medications Prior to Admission medications   Medication Sig Start Date End Date Taking? Authorizing Provider  molnupiravir EUA (LAGEVRIO) 200 mg CAPS capsule Take 4 capsules (800 mg total) by mouth 2 (two) times daily for 5 days. 03/16/22 03/21/22 Yes Dorie Rank, MD  Cholecalciferol (VITAMIN D-3) 1000 UNITS CAPS Take 1,000 Units by mouth daily.     [provider]  diltiazem (CARDIZEM) 30 MG tablet Take 1 tablet every 4 hours AS NEEDED for heart rate >100 as long as top blood pressure >100. 07/09/20   Fenton, Clint R, PA  EPIPEN 2-PAK 0.3 MG/0.3ML SOAJ injection Inject 0.3 mg into the muscle as needed for anaphylaxis. 03/05/20   [provider]      Allergies    Apple juice, Cephalosporins, Other, and  Penicillins    Review of Systems   Review of Systems  Respiratory:  Positive for shortness of breath.   Cardiovascular:  Positive for palpitations.    Physical Exam Updated Vital Signs BP (!) 92/59   Pulse 73   Temp 100.1 F (37.8 C)   Resp 14   SpO2 97%  Physical Exam Vitals and nursing note reviewed.  Constitutional:      General: She is not in acute distress.    Appearance: She is well-developed.  HENT:     Head: Normocephalic and atraumatic.     Right Ear: External ear normal.     Left Ear: External ear normal.  Eyes:     General: No scleral icterus.       Right eye: No discharge.        Left eye: No discharge.     Conjunctiva/sclera: Conjunctivae normal.  Neck:     Trachea: No tracheal deviation.  Cardiovascular:     Rate and Rhythm: Regular rhythm. Tachycardia present.  Pulmonary:     Effort: Pulmonary effort is normal. No respiratory distress.     Breath sounds: Normal breath sounds. No stridor. No wheezing or rales.  Abdominal:     General: Bowel sounds are normal. There is no distension.     Palpations: Abdomen is soft.     Tenderness: There is no abdominal tenderness. There is  no guarding or rebound.  Musculoskeletal:        General: No tenderness or deformity.     Cervical back: Neck supple.  Skin:    General: Skin is warm and dry.     Findings: No rash.  Neurological:     General: No focal deficit present.     Mental Status: She is alert.     Cranial Nerves: No cranial nerve deficit (no facial droop, extraocular movements intact, no slurred speech).     Sensory: No sensory deficit.     Motor: No abnormal muscle tone or seizure activity.     Coordination: Coordination normal.  Psychiatric:        Mood and Affect: Mood normal.     ED Results / Procedures / Treatments   Labs (all labs ordered are listed, but only abnormal results are displayed) Labs Reviewed  SARS CORONAVIRUS 2 BY RT PCR - Abnormal; Notable for the following components:       Result Value   SARS Coronavirus 2 by RT PCR POSITIVE (*)    All other components within normal limits    EKG EKG Interpretation  Date/Time:  Wednesday March 16 2022 16:53:19 EDT Ventricular Rate:  98 PR Interval:  142 QRS Duration: 74 QT Interval:  333 QTC Calculation: 426 R Axis:   60 Text Interpretation: Sinus rhythm tachycardia resolved Confirmed by Dorie Rank [phone]) on 03/16/2022 5:00:55 PM  Radiology No results found.  Procedures Procedures    Medications Ordered in ED Medications  acetaminophen (TYLENOL) tablet 650 mg (650 mg Oral Given 03/16/22 1745)    ED Course/ Medical Decision Making/ A&P Clinical Course as of 03/16/22 1848  Wed Mar 16, 2022  1648 Heart rate at the bedside is now 110.  Patient has refused any testing. [JK]  1843 Heart rate has remained stable in the 70s [JK]    Clinical Course User Index [JK] Dorie Rank, MD                           Medical Decision Making Problems Addressed: COVID-19 virus infection: acute illness or injury  Risk OTC drugs. Prescription drug management.  Pt dented with request for COVID test.  Patient has been exposed recently.  Patient initially did have an episode of extreme tachycardia.  Initially plan of doing further evaluation including laboratory testing and IV medications.  Patient did not want any of this done.  She states this happens to her sometimes and she usually responds to the Cardizem which she took.  Patient was monitored in the ED and her heart rate did decrease without intervention.  She was not given any additional medications.  Patient has remained stable.  No signs of severe sepsis.  She is not having any difficulty breathing.  Will discharge home with prescription for molnupiravir as she requests         Final Clinical Impression(s) / ED Diagnoses Final diagnoses:  COVID-19 virus infection  Tachycardia    Rx / DC Orders ED Discharge Orders          Ordered    molnupiravir EUA  (LAGEVRIO) 200 mg CAPS capsule  2 times daily        03/16/22 1842              Dorie Rank, MD 03/16/22 [phone]

## 2022-03-16 NOTE — ED Notes (Signed)
PT refuses labs and iv. She states she only wants to be tested for covid. Dr. Tomi Bamberger at bedside and aware of patient's wishes.

## 2022-03-16 NOTE — Discharge Instructions (Signed)
Take medications as prescribed.  Return to the hospital if you start having shortness of breath and difficulty breathing.  Take Tylenol for fevers.

## 2022-03-17 ENCOUNTER — Telehealth: Payer: Self-pay | Admitting: Interventional Cardiology

## 2022-03-17 NOTE — Telephone Encounter (Signed)
Patient is calling stating she was diagnosed with covid yesterday and is wanting to know what treatments she should use due to her afib. Please advise.

## 2022-03-17 NOTE — Telephone Encounter (Signed)
Spoke with pt who states she was diagnosed with Covid yesterday at ED and prescribed Molnupiravir and would like Rx for Paxlovid instead.  Pt advised Dr Irish Lack is out of the office today and recommend pt contact her PCP fore prescription and further management.  Pt verbalizes understanding and agrees with current plan.

## 2022-03-21 NOTE — Progress Notes (Deleted)
Cardiology Office Note    Date:  03/21/2022   ID:  Shelly Mahoney, DOB 02/26/1961, MRN [id number]  PCP:  Lujean Amel, MD  Cardiologist:  Larae Grooms, MD  Electrophysiologist:  None   Chief Complaint: ***  History of Present Illness:   Shelly Mahoney is a 61 y.o. female with history of remote syncope 2014, atypical chest pain 2019 (testing not pursued at that time), PACs, intermittent PAF by monitor 2021, allergic rhinitis, uterine fibroids s/p hysterectomy, ADHD, viral meningitis, baseline hypotension who is seen for follow-up.   She has a remote history of palpitations with Holter in 2014 patient reported was benign. She also previous reported being diagnosed with a heart murmur when she was profoundly anemic in the setting of uterine fibroids/menorrhagia requiring hysterectomy. This resolved her anemia. She was seen in the ED for palpitations in 10/2019 but was in NSR by the time of evaluation. In 05/2020 she was seen in the ED for palpitations. Initially there was concern for atrial fib. She was given IV Cardizem. Initial EKG was a poor quality tracing in the first half of the EKG with possible irregularity, then appeared to show NSR with PAC followed by compensatory pause when more clearly defined in the latter half of the tracing. F/u EKG after that was also NSR. She was started on oral diltiazem but had low BP with this so stopped it. A follow-up monitor did demonstrate bursts of atrial fib so she was started on anticoagulation and referred to the afib clinic, last OV there 07/2020. Given overall low burden of AF they did not feel she needed antiarrhythmic therapy at that time and recommended PRN diltiazem. She did have 2 interim ER visits 09/2020 and 01/2021 with breakthrough symptoms. Last echo 07/2021 with mild dilation of ascending aorta, EF 60%, no significant valve disease.   Cta of aortic dilation  Lytes  Whos got lipids   Paroxysmal atrial fibrillation  Premature atrial  contractions  Hypotension (baseline)  Aortic dilatation   Labwork independently reviewed: 01/2021 Mg 1.8, Hgb 15.3, plt 259, K 3.4, Cr 0.99  10/2019 TSH wnl    Cardiology Studies:   Studies reviewed are outlined and summarized above. Reports included below if pertinent.   2D echo 07/2021    1. Aortic dilatation noted. There is mild dilatation of the ascending  aorta, measuring 38 mm and based on age, gender, and body surface area.   2. Left ventricular ejection fraction, by estimation, is 60 to 65%. Left  ventricular ejection fraction by 3D volume is 60 %. The left ventricle has  normal function. The left ventricle has no regional wall motion  abnormalities. Left ventricular diastolic   parameters were normal.   3. The mitral valve is normal in structure. Trivial mitral valve  regurgitation. No evidence of mitral stenosis.   4. The aortic valve is tricuspid. Aortic valve regurgitation is not  visualized. No aortic stenosis is present.   5. Right ventricular systolic function is normal. The right ventricular  size is normal. Tricuspid regurgitation signal is inadequate for assessing  PA pressure.   Monitor 06/2020   Intermittent Atrial fibrillation with rapid ventricular rates.   Predominantly NSR.     Past Medical History:  Diagnosis Date   A-fib [facility])    ADHD    [place]  ATTENTIONS SPECIALIST   Allergic rhinitis    Arrhythmia    Blood in stool    Fibroid, uterine    Hemorrhoids    Herpes  Menorrhagia    Palpitations    Posterior vitreous detachment    Rectal bleeding    Syncope    Viral meningitis     Past Surgical History:  Procedure Laterality Date   CESAREAN SECTION  1991   COLONOSCOPY     TOTAL ABDOMINAL HYSTERECTOMY  2005    Current Medications: No outpatient medications have been marked as taking for the 03/22/22 encounter (Appointment) with [name], PA-C.   ***   Allergies:   Apple juice, Cephalosporins, Other, and Penicillins    Social History   Socioeconomic History   Marital status: Married    Spouse name: Not on file   Number of children: 2   Years of education: Not on file   Highest education level: Not on file  Occupational History   Occupation: HOME MAKER  Tobacco Use   Smoking status: Never   Smokeless tobacco: Never  Vaping Use   Vaping Use: Never used  Substance and Sexual Activity   Alcohol use: Yes    Alcohol/week: 1.0 standard drink of alcohol    Types: 1 Glasses of wine per week   Drug use: No   Sexual activity: Not on file  Other Topics Concern   Not on file  Social History Narrative   Not on file   Social Determinants of Health   Financial Resource Strain: Not on file  Food Insecurity: Not on file  Transportation Needs: Not on file  Physical Activity: Not on file  Stress: Not on file  Social Connections: Not on file     Family History:  The patient's ***family history includes Arthritis in her mother; Colon polyps (age of onset: 49) in her father; Hypertension in her mother; Osteoporosis in her mother; Other in her father and mother; Stroke in her mother. There is no history of Colon cancer, CAD, Liver cancer, or Kidney cancer.  ROS:   Please see the history of present illness. Otherwise, review of systems is positive for ***.  All other systems are reviewed and otherwise negative.    EKG(s)/Additional Labs   EKG:  EKG is ordered today, personally reviewed, demonstrating ***  Recent Labs: No results found for requested labs within last 365 days.  Recent Lipid Panel No results found for: "CHOL", "TRIG", "HDL", "CHOLHDL", "VLDL", "LDLCALC", "LDLDIRECT"  PHYSICAL EXAM:    VS:  There were no vitals taken for this visit.  BMI: There is no height or weight on file to calculate BMI.  GEN: Well nourished, well developed female in no acute distress HEENT: normocephalic, atraumatic Neck: no JVD, carotid bruits, or masses Cardiac: ***RRR; no murmurs, rubs, or gallops, no  edema  Respiratory:  clear to auscultation bilaterally, normal work of breathing GI: soft, nontender, nondistended, + BS MS: no deformity or atrophy Skin: warm and dry, no rash Neuro:  Alert and Oriented x 3, Strength and sensation are intact, follows commands Psych: euthymic mood, full affect  Wt Readings from Last 3 Encounters:  02/09/21 135 lb (61.2 kg)  09/25/20 134 lb 14.7 oz (61.2 kg)  08/11/20 135 lb (61.2 kg)     ASSESSMENT & PLAN:   ***     Disposition: F/u with ***   Medication Adjustments/Labs and Tests Ordered: Current medicines are reviewed at length with the patient today.  Concerns regarding medicines are outlined above. Medication changes, Labs and Tests ordered today are summarized above and listed in the Patient Instructions accessible in Encounters.   Signed, [name], PA-C  03/21/2022  12:42 PM    [place] Phone: [phone]; Fax: [phone]

## 2022-03-22 ENCOUNTER — Other Ambulatory Visit (HOSPITAL_COMMUNITY): Payer: Self-pay | Admitting: *Deleted

## 2022-03-22 ENCOUNTER — Telehealth: Payer: Self-pay | Admitting: Interventional Cardiology

## 2022-03-22 ENCOUNTER — Ambulatory Visit: Payer: BC Managed Care – PPO | Admitting: Physician Assistant

## 2022-03-22 DIAGNOSIS — I480 Paroxysmal atrial fibrillation: Secondary | ICD-10-CM

## 2022-03-22 DIAGNOSIS — I959 Hypotension, unspecified: Secondary | ICD-10-CM

## 2022-03-22 DIAGNOSIS — I77819 Aortic ectasia, unspecified site: Secondary | ICD-10-CM

## 2022-03-22 DIAGNOSIS — I491 Atrial premature depolarization: Secondary | ICD-10-CM

## 2022-03-22 MED ORDER — DILTIAZEM HCL 30 MG PO TABS: ORAL_TABLET | ORAL | 1 refills | Status: DC

## 2022-03-22 NOTE — Telephone Encounter (Signed)
error 

## 2022-04-08 ENCOUNTER — Ambulatory Visit: Payer: BC Managed Care – PPO | Admitting: Nurse Practitioner

## 2022-04-08 NOTE — Progress Notes (Deleted)
Cardiology Office Note    Date:  04/08/2022   ID:  Shelly Mahoney, DOB June 16, 1961, MRN [id number]  PCP:  Shelly Amel, MD  Cardiologist:  Larae Grooms, MD  Electrophysiologist:  None   Chief Complaint: ***  History of Present Illness:   Shelly Mahoney is a 61 y.o. female with history of remote syncope 2014, atypical chest pain 2019 (testing not pursued at that time), PACs, intermittent PAF by monitor 2021, allergic rhinitis, uterine fibroids s/p hysterectomy, ADHD, viral meningitis, baseline hypotension who is seen for follow-up.   She has a remote history of palpitations with Holter in 2014 patient reported was benign. She also previous reported being diagnosed with a heart murmur when she was profoundly anemic in the setting of uterine fibroids/menorrhagia requiring hysterectomy. This resolved her anemia. She was seen in the ED for palpitations in 10/2019 but was in NSR by the time of evaluation. In 05/2020 she was seen in the ED for palpitations. Initially there was concern for atrial fib. She was given IV Cardizem. Initial EKG was a poor quality tracing in the first half of the EKG with possible irregularity, then appeared to show NSR with PAC followed by compensatory pause when more clearly defined in the latter half of the tracing. F/u EKG after that was also NSR. She was started on oral diltiazem but had low BP with this so stopped it. A follow-up monitor did demonstrate bursts of atrial fib so she was started on anticoagulation and referred to the afib clinic, last OV there 07/2020. Given overall low burden of AF they did not feel she needed antiarrhythmic therapy at that time and recommended PRN diltiazem. She did have 2 interim ER visits 09/2020 and 01/2021 with breakthrough symptoms. Last echo 07/2021 with mild dilation of ascending aorta, EF 60%, no significant valve disease.   Cta of aortic dilation  Lytes  Whos got lipids   Paroxysmal atrial fibrillation  Premature atrial  contractions  Hypotension (baseline)  Aortic dilatation    Labwork independently reviewed:  01/2021 Mg 1.8, Hgb 15.3, plt 259, K 3.4, Cr 0.99  10/2019 TSH wnl    Cardiology Studies:   Studies reviewed are outlined and summarized above. Reports included below if pertinent.   2D echo 07/2021    1. Aortic dilatation noted. There is mild dilatation of the ascending  aorta, measuring 38 mm and based on age, gender, and body surface area.   2. Left ventricular ejection fraction, by estimation, is 60 to 65%. Left  ventricular ejection fraction by 3D volume is 60 %. The left ventricle has  normal function. The left ventricle has no regional wall motion  abnormalities. Left ventricular diastolic   parameters were normal.   3. The mitral valve is normal in structure. Trivial mitral valve  regurgitation. No evidence of mitral stenosis.   4. The aortic valve is tricuspid. Aortic valve regurgitation is not  visualized. No aortic stenosis is present.   5. Right ventricular systolic function is normal. The right ventricular  size is normal. Tricuspid regurgitation signal is inadequate for assessing  PA pressure.   Monitor 06/2020   Intermittent Atrial fibrillation with rapid ventricular rates.   Predominantly NSR.     Currently being followed in AFib clinic.  Consider flecainide and metoprolol.  If rhythm not controlled, would refer to EP for consideration of ablation.         Past Medical History:  Diagnosis Date   A-fib [facility])    ADHD  [place]  ATTENTIONS SPECIALIST   Allergic rhinitis    Arrhythmia    Blood in stool    Fibroid, uterine    Hemorrhoids    Herpes    Menorrhagia    Palpitations    Posterior vitreous detachment    Rectal bleeding    Syncope    Viral meningitis     Past Surgical History:  Procedure Laterality Date   CESAREAN SECTION  1991   COLONOSCOPY     TOTAL ABDOMINAL HYSTERECTOMY  2005    Current Medications: No outpatient medications have been  marked as taking for the 04/11/22 encounter (Appointment) with [name], PA-C.   ***   Allergies:   Apple juice, Cephalosporins, Other, and Penicillins   Social History   Socioeconomic History   Marital status: Married    Spouse name: Not on file   Number of children: 2   Years of education: Not on file   Highest education level: Not on file  Occupational History   Occupation: HOME MAKER  Tobacco Use   Smoking status: Never   Smokeless tobacco: Never  Vaping Use   Vaping Use: Never used  Substance and Sexual Activity   Alcohol use: Yes    Alcohol/week: 1.0 standard drink of alcohol    Types: 1 Glasses of wine per week   Drug use: No   Sexual activity: Not on file  Other Topics Concern   Not on file  Social History Narrative   Not on file   Social Determinants of Health   Financial Resource Strain: Not on file  Food Insecurity: Not on file  Transportation Needs: Not on file  Physical Activity: Not on file  Stress: Not on file  Social Connections: Not on file     Family History:  The patient's ***family history includes Arthritis in her mother; Colon polyps (age of onset: 42) in her father; Hypertension in her mother; Osteoporosis in her mother; Other in her father and mother; Stroke in her mother. There is no history of Colon cancer, CAD, Liver cancer, or Kidney cancer.  ROS:   Please see the history of present illness. Otherwise, review of systems is positive for ***.  All other systems are reviewed and otherwise negative.    EKG(s)/Additional Labs   EKG:  EKG is ordered today, personally reviewed, demonstrating ***  Recent Labs: No results found for requested labs within last 365 days.  Recent Lipid Panel No results found for: "CHOL", "TRIG", "HDL", "CHOLHDL", "VLDL", "LDLCALC", "LDLDIRECT"  PHYSICAL EXAM:    VS:  There were no vitals taken for this visit.  BMI: There is no height or weight on file to calculate BMI.  GEN: Well nourished, well  developed female in no acute distress HEENT: normocephalic, atraumatic Neck: no JVD, carotid bruits, or masses Cardiac: ***RRR; no murmurs, rubs, or gallops, no edema  Respiratory:  clear to auscultation bilaterally, normal work of breathing GI: soft, nontender, nondistended, + BS MS: no deformity or atrophy Skin: warm and dry, no rash Neuro:  Alert and Oriented x 3, Strength and sensation are intact, follows commands Psych: euthymic mood, full affect  Wt Readings from Last 3 Encounters:  02/09/21 135 lb (61.2 kg)  09/25/20 134 lb 14.7 oz (61.2 kg)  08/11/20 135 lb (61.2 kg)     ASSESSMENT & PLAN:   ***     Disposition: F/u with ***   Medication Adjustments/Labs and Tests Ordered: Current medicines are reviewed at length with the patient today.  Concerns regarding medicines are outlined above. Medication changes, Labs and Tests ordered today are summarized above and listed in the Patient Instructions accessible in Encounters.   Signed, [name], PA-C  04/08/2022 5:23 PM    [place] Phone: [phone]; Fax: [phone]

## 2022-04-11 ENCOUNTER — Ambulatory Visit: Payer: BC Managed Care – PPO | Admitting: Physician Assistant

## 2022-04-11 DIAGNOSIS — I959 Hypotension, unspecified: Secondary | ICD-10-CM

## 2022-04-11 DIAGNOSIS — I491 Atrial premature depolarization: Secondary | ICD-10-CM

## 2022-04-11 DIAGNOSIS — I480 Paroxysmal atrial fibrillation: Secondary | ICD-10-CM

## 2022-04-11 DIAGNOSIS — I77819 Aortic ectasia, unspecified site: Secondary | ICD-10-CM

## 2022-04-28 NOTE — Progress Notes (Deleted)
Cardiology Office Note    Date:  04/28/2022   ID:  Shelly Mahoney, DOB 06-02-1961, MRN [id number]  PCP:  Shelly Amel, MD  Cardiologist:  Shelly Grooms, MD  Electrophysiologist:  None   Chief Complaint: ***  History of Present Illness:   Shelly Mahoney is a 61 y.o. female with history of  remote syncope 2014, atypical chest pain 2019 (testing not pursued at that time), PACs, intermittent PAF by monitor 2021, allergic rhinitis, uterine fibroids s/p hysterectomy, ADHD, viral meningitis, baseline hypotension who is seen for follow-up.   She has a remote history of palpitations with Holter in 2014 patient reported was benign. She also previous reported being diagnosed with a heart murmur when she was profoundly anemic in the setting of uterine fibroids/menorrhagia requiring hysterectomy. This resolved her anemia. She was seen in the ED for palpitations in 10/2019 but was in NSR by the time of evaluation. In 05/2020 she was seen in the ED for palpitations. Initially there was concern for atrial fib. She was given IV Cardizem. Initial EKG was a poor quality tracing in the first half of the EKG with possible irregularity, then appeared to show NSR with PAC followed by compensatory pause when more clearly defined in the latter half of the tracing. F/u EKG after that was also NSR. She was started on oral diltiazem but had low BP with this so stopped it. A follow-up monitor did demonstrate bursts of atrial fib so she was started on anticoagulation and referred to the afib clinic, last OV there 07/2020. Given overall low burden of AF they did not feel she needed antiarrhythmic therapy at that time and recommended PRN diltiazem. She did have 2 interim ER visits 09/2020 and 01/2021 with breakthrough symptoms. Last echo 07/2021 with mild dilation of ascending aorta, EF 60%, no significant valve disease.   Cta of aortic dilation  Lytes  Whos got lipids   Paroxysmal atrial fibrillation  Premature atrial  contractions  Hypotension (baseline)  Aortic dilatation   Labwork independently reviewed: 01/2021 Mg 1.8, Hgb 15.3, plt 259, K 3.4, Cr 0.99  10/2019 TSH wnl    Cardiology Studies:   Studies reviewed are outlined and summarized above. Reports included below if pertinent.    2D echo 07/2021    1. Aortic dilatation noted. There is mild dilatation of the ascending  aorta, measuring 38 mm and based on age, gender, and body surface area.   2. Left ventricular ejection fraction, by estimation, is 60 to 65%. Left  ventricular ejection fraction by 3D volume is 60 %. The left ventricle has  normal function. The left ventricle has no regional wall motion  abnormalities. Left ventricular diastolic   parameters were normal.   3. The mitral valve is normal in structure. Trivial mitral valve  regurgitation. No evidence of mitral stenosis.   4. The aortic valve is tricuspid. Aortic valve regurgitation is not  visualized. No aortic stenosis is present.   5. Right ventricular systolic function is normal. The right ventricular  size is normal. Tricuspid regurgitation signal is inadequate for assessing  PA pressure.   Monitor 06/2020   Intermittent Atrial fibrillation with rapid ventricular rates.   Predominantly NSR.     Currently being followed in AFib clinic.  Consider flecainide and metoprolol.  If rhythm not controlled, would refer to EP for consideration of ablation.          Past Medical History:  Diagnosis Date   A-fib [facility])    ADHD  [place]  ATTENTIONS SPECIALIST   Allergic rhinitis    Arrhythmia    Blood in stool    Fibroid, uterine    Hemorrhoids    Herpes    Menorrhagia    Palpitations    Posterior vitreous detachment    Rectal bleeding    Syncope    Viral meningitis     Past Surgical History:  Procedure Laterality Date   CESAREAN SECTION  1991   COLONOSCOPY     TOTAL ABDOMINAL HYSTERECTOMY  2005    Current Medications: No outpatient medications have been  marked as taking for the 04/29/22 encounter (Appointment) with [name], PA-C.   ***   Allergies:   Apple juice, Cephalosporins, Other, and Penicillins   Social History   Socioeconomic History   Marital status: Married    Spouse name: Not on file   Number of children: 2   Years of education: Not on file   Highest education level: Not on file  Occupational History   Occupation: HOME MAKER  Tobacco Use   Smoking status: Never   Smokeless tobacco: Never  Vaping Use   Vaping Use: Never used  Substance and Sexual Activity   Alcohol use: Yes    Alcohol/week: 1.0 standard drink of alcohol    Types: 1 Glasses of wine per week   Drug use: No   Sexual activity: Not on file  Other Topics Concern   Not on file  Social History Narrative   Not on file   Social Determinants of Health   Financial Resource Strain: Not on file  Food Insecurity: Not on file  Transportation Needs: Not on file  Physical Activity: Not on file  Stress: Not on file  Social Connections: Not on file     Family History:  The patient's ***family history includes Arthritis in her mother; Colon polyps (age of onset: 80) in her father; Hypertension in her mother; Osteoporosis in her mother; Other in her father and mother; Stroke in her mother. There is no history of Colon cancer, CAD, Liver cancer, or Kidney cancer.  ROS:   Please see the history of present illness. Otherwise, review of systems is positive for ***.  All other systems are reviewed and otherwise negative.    EKG(s)/Additional Labs   EKG:  EKG is ordered today, personally reviewed, demonstrating ***  Recent Labs: No results found for requested labs within last 365 days.  Recent Lipid Panel No results found for: "CHOL", "TRIG", "HDL", "CHOLHDL", "VLDL", "LDLCALC", "LDLDIRECT"  PHYSICAL EXAM:    VS:  There were no vitals taken for this visit.  BMI: There is no height or weight on file to calculate BMI.  GEN: Well nourished, well  developed female in no acute distress HEENT: normocephalic, atraumatic Neck: no JVD, carotid bruits, or masses Cardiac: ***RRR; no murmurs, rubs, or gallops, no edema  Respiratory:  clear to auscultation bilaterally, normal work of breathing GI: soft, nontender, nondistended, + BS MS: no deformity or atrophy Skin: warm and dry, no rash Neuro:  Alert and Oriented x 3, Strength and sensation are intact, follows commands Psych: euthymic mood, full affect  Wt Readings from Last 3 Encounters:  02/09/21 135 lb (61.2 kg)  09/25/20 134 lb 14.7 oz (61.2 kg)  08/11/20 135 lb (61.2 kg)     ASSESSMENT & PLAN:   ***     Disposition: F/u with ***   Medication Adjustments/Labs and Tests Ordered: Current medicines are reviewed at length with the patient today.  Concerns regarding medicines are outlined above. Medication changes, Labs and Tests ordered today are summarized above and listed in the Patient Instructions accessible in Encounters.   Signed, [name], PA-C  04/28/2022 10:08 AM    [place] Phone: [phone]; Fax: [phone]

## 2022-04-29 ENCOUNTER — Ambulatory Visit: Payer: BC Managed Care – PPO | Admitting: Physician Assistant

## 2022-05-27 NOTE — Progress Notes (Unsigned)
Cardiology Office Note    Date:  05/30/2022   ID:  Shelly Mahoney, DOB [birth year], MRN [id number]  PCP:  Lujean Amel, MD  Cardiologist:  Larae Grooms, MD  Electrophysiologist:  None   Chief Complaint: f/u PAF  History of Present Illness:   Shelly Mahoney is a 61 y.o. female with history of remote syncope 2014, atypical chest pain 2019 (testing not pursued at that time), PACs, intermittent PAF by monitor 2021, allergic rhinitis, uterine fibroids s/p hysterectomy, ADHD, viral meningitis, baseline hypotension who is seen for follow-up.  She has a remote history of palpitations with Holter in 2014 patient reported was benign. She also previous reported being diagnosed with a heart murmur when she was profoundly anemic in the setting of uterine fibroids/menorrhagia requiring hysterectomy. This resolved her anemia. She was seen in the ED for palpitations in 10/2019 but was in NSR by the time of evaluation. In 05/2020 she was seen in the ED for palpitations. Initially there was concern for atrial fib - iInitial EKG was a poor quality tracing in the first half of the EKG with possible irregularity, then appeared to show NSR with PAC followed by compensatory pause when more clearly defined in the latter half of the tracing. She was treated with IV diltiazem. F/u EKG after that was also NSR. Following this encounter, she was started on oral diltiazem but had low BP with this so stopped it. A follow-up monitor did demonstrate bursts of atrial fib so she was started on anticoagulation and referred to the afib clinic, last OV there 07/2020. Given overall low burden of AF they did not feel she needed antiarrhythmic therapy at that time and recommended PRN diltiazem; anticoagulation was not felt necessary due to [facility] of 1. She did have 2 interim ER visits 09/2020 and 01/2021 with breakthrough symptoms. Last echo 07/2021 with mild dilation of ascending aorta, EF 60%, no significant valve disease. She also has  baseline hypotension with home BP tending to run in the 90s chronically.  She is seen for follow-up today. Since last visit she reports about 6 episodes of short lived palpitations over the last 12 months, in a variety of lengths, but up to 2-8 hours at times. She has learned that these are associated with lying on her left side, more strenuous exertion (though other times able to exercise without difficulty), excess caffeine, and large meals. Fortunately she feels that diltiazem is a miracle drug and works great to abate symptoms within 2-4 hours. She has done a lot of reading online and we discussed several of her questions.    Labwork independently reviewed: 01/2021 Mg 1.8, Hgb 15.3, plt 259, K 3.4, Cr 0.99 10/2019 TSH wnl    Cardiology Studies:   Studies reviewed are outlined and summarized above. Reports included below if pertinent.   2D echo 07/2021   1. Aortic dilatation noted. There is mild dilatation of the ascending aorta, measuring 38 mm and based on age, gender, and body surface area.  2. Left ventricular ejection fraction, by estimation, is 60 to 65%. Left ventricular ejection fraction by 3D volume is 60 %. The left ventricle has normal function. The left ventricle has no regional wall motion abnormalities. Left ventricular diastolic  parameters were normal.  3. The mitral valve is normal in structure. Trivial mitral valve regurgitation. No evidence of mitral stenosis.  4. The aortic valve is tricuspid. Aortic valve regurgitation is not visualized. No aortic stenosis is present.  5. Right ventricular systolic function is normal.  The right ventricular size is normal. Tricuspid regurgitation signal is inadequate for assessing PA pressure.  Monitor 06/2020  Intermittent Atrial fibrillation with rapid ventricular rates.  Predominantly NSR.   Currently being followed in AFib clinic.  Consider flecainide and metoprolol.  If rhythm not controlled, would refer to EP for  consideration of ablation.         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    diltiazem (CARDIZEM) 30 MG tablet Take 1 tablet every 4 hours AS NEEDED for heart rate >100 as long as top blood pressure >100.   EPIPEN 2-PAK 0.3 MG/0.3ML SOAJ injection Inject 0.3 mg into the muscle as needed for anaphylaxis.      Allergies:   Apple juice, Cephalosporins, Other, and Penicillins   Social History   Socioeconomic History   Marital status: Married    Spouse name: Not on file   Number of children: 2   Years of education: Not on file   Highest education level: Not on file  Occupational History   Occupation: HOME MAKER  Tobacco Use   Smoking status: Never   Smokeless tobacco: Never  Vaping Use   Vaping Use: Never used  Substance and Sexual Activity   Alcohol use: Yes    Alcohol/week: 1.0 standard drink of alcohol    Types: 1 Glasses of wine per week   Drug use: No   Sexual activity: Not on file  Other Topics Concern   Not on file  Social History Narrative   Not on file   Social Determinants of Health   Financial Resource Strain: Not on file  Food Insecurity: Not on file  Transportation Needs: Not on file  Physical Activity: Not on file  Stress: Not on file  Social Connections: Not on file     Family History:  The patient's family history includes Arthritis in her mother; Colon polyps (age of onset: 76) in her father;  Hypertension in her mother; Osteoporosis in her mother; Other in her father and mother; Stroke in her mother. There is no history of Colon cancer, CAD, Liver cancer, or Kidney cancer.  ROS:   Please see the history of present illness All other systems are reviewed and otherwise negative.    EKG(s)/Additional Labs   EKG:  EKG is ordered today, personally reviewed, demonstrating NSR 64bpm no acute STT changes  Recent Labs: No results found for requested labs within last 365 days.  Recent Lipid Panel No results found for: "CHOL", "TRIG", "HDL", "CHOLHDL", "VLDL", "LDLCALC", "LDLDIRECT"  PHYSICAL EXAM:    VS:  BP 116/72   Pulse 64   Ht 5' 5.5" (1.664 m)   Wt 134 lb (60.8 kg)   SpO2 99%   BMI 21.96 kg/m   BMI: Body mass index is 21.96 kg/m.  GEN: Well nourished, well developed female in no acute distress HEENT: normocephalic, atraumatic Neck: no JVD, carotid bruits, or masses Cardiac: RRR; no murmurs, rubs, or gallops, no edema  Respiratory:  clear to auscultation bilaterally, normal work of breathing GI: soft, nontender, nondistended, + BS MS: no deformity or atrophy Skin: warm and dry, no rash Neuro:  Alert and Oriented x 3, Strength and sensation are intact, follows commands Psych: euthymic mood, full affect  Wt Readings from Last 3 Encounters:  05/30/22 134 lb (60.8 kg)  02/09/21 135 lb (61.2 kg)  09/25/20 134 lb 14.7 oz (61.2 kg)     ASSESSMENT & PLAN:   1. Paroxysmal atrial fibrillation, also h/o premature atrial contractions - still with intermittent symptoms albeit relatively low burden. She has now has identified several of her triggers and has learned to avoid them. She inquires about whether this may represent the vagal variant of atrial fib. She otherwise does not carry a lot of the traditional risk factors. She does know to reduce her caffeine intake. At this juncture I'd like her to establish with one of our EP physicians for their input.  Unfortunately, she has  a tendency for baseline low BP in the 90s chronically so had not been previously able to tolerate standing diltiazem. Question whether low dose beta blocker would be a consideration. We discussed ablation as a potential option; she is generally disinclined for procedures. She also relays some relationship of palpitations to higher intensity exercise - appreciate EP input whether this would warrant EPS versus ETT. I will update her labs today and continue PRN diltiazem in the meantime. With regard to anticoagulation, she was not felt to require this by afib clinic as CHADSVASC was felt to be 1. I have recommended a coronary calcium score to help rule in/rule out vascular disease in this calculation. This would also possibly help [place] follow-up her aortic dimension. She is concerned about proceeding due to potential radiation. I have asked her to consider this and let me know. If she elects not to pursue, we will still need to do an echo in 07/2022 which we can order when she lets [place] know her decision.  2. Hypotension (baseline) - patient's BP is typically normal in the office - she cites this is because she is very nervous here. She otherwise tends to have a BP in the 42A systolic chronically at home without any specific problems tolerating this. However, it does limit our ability to manage her PAF.  3. Aortic dilatation - last assessed 07/2021, mild dilation. If she elects not to pursue calcium score, would need repeat echo 07/2022. She is aware. We will await her decision.  4. Hypokalemia - recheck lytes with general labs today.   5. Encounter for lipid screening - she will return tomorrow for fasting labs.     Disposition: Refer to EP for evaluation.   Medication Adjustments/Labs and Tests Ordered: Current medicines are reviewed at length with the patient today.  Concerns regarding medicines are outlined above. Medication changes, Labs and Tests ordered today are summarized above and listed in the  Patient Instructions accessible in Encounters.   Signed, [name], PA-C  05/30/2022 2:06 PM    [place] Phone: [phone]; Fax: [phone]

## 2022-05-30 ENCOUNTER — Encounter: Payer: Self-pay | Admitting: Physician Assistant

## 2022-05-30 ENCOUNTER — Ambulatory Visit: Payer: BC Managed Care – PPO | Attending: Physician Assistant | Admitting: Physician Assistant

## 2022-05-30 VITALS — BP 116/72 | HR 64 | Ht 65.5 in | Wt 134.0 lb

## 2022-05-30 DIAGNOSIS — I77819 Aortic ectasia, unspecified site: Secondary | ICD-10-CM

## 2022-05-30 DIAGNOSIS — I959 Hypotension, unspecified: Secondary | ICD-10-CM

## 2022-05-30 DIAGNOSIS — I480 Paroxysmal atrial fibrillation: Secondary | ICD-10-CM

## 2022-05-30 DIAGNOSIS — I491 Atrial premature depolarization: Secondary | ICD-10-CM

## 2022-05-30 NOTE — Patient Instructions (Signed)
Medication Instructions:   Your physician recommends that you continue on your current medications as directed. Please refer to the Current Medication list given to you today.   *If you need a refill on your cardiac medications before your next appointment, please call your pharmacy*   Lab Work:  RETURN FOR BMET MAG LIPIDS CBC AND TSH   If you have labs (blood work) drawn today and your tests are completely normal, you will receive your results only by: [place] (if you have MyChart) OR A paper copy in the mail If you have any lab test that is abnormal or we need to change your treatment, we will call you to review the results.   Testing/Procedures: Give the coronary calcium score (CT) some thought. Let me know what you decide.    Follow-Up: At [facility], you and your health needs are our priority.  As part of our continuing mission to provide you with exceptional heart care, we have created designated Provider Care Teams.  These Care Teams include your primary Cardiologist (physician) and Advanced Practice Providers (APPs -  Physician Assistants and Nurse Practitioners) who all work together to provide you with the care you need, when you need it.  We recommend signing up for the patient portal called "MyChart".  Sign up information is provided on this After Visit Summary.  MyChart is used to connect with patients for Virtual Visits (Telemedicine).  Patients are able to view lab/test results, encounter notes, upcoming appointments, etc.  Non-urgent messages can be sent to your provider as well.   To learn more about what you can do with MyChart, go to [email or web address].    Your next appointment:  NEXT AVAILABLE WIT EP FOR PAF    The format for your next appointment:   In Person  Provider:  AVAILABLE PROVIDER    Other Instructions:    Important Information About Sugar

## 2022-05-31 ENCOUNTER — Other Ambulatory Visit: Payer: BC Managed Care – PPO

## 2022-06-01 ENCOUNTER — Ambulatory Visit: Payer: BC Managed Care – PPO | Attending: Physician Assistant

## 2022-06-01 DIAGNOSIS — I959 Hypotension, unspecified: Secondary | ICD-10-CM

## 2022-06-01 DIAGNOSIS — I480 Paroxysmal atrial fibrillation: Secondary | ICD-10-CM

## 2022-06-01 DIAGNOSIS — I491 Atrial premature depolarization: Secondary | ICD-10-CM

## 2022-06-02 LAB — CBC
Hematocrit: 42.2 % (ref 34.0–46.6)
Hemoglobin: 14 g/dL (ref 11.1–15.9)
MCH: 29 pg (ref 26.6–33.0)
MCHC: 33.2 g/dL (ref 31.5–35.7)
MCV: 87 fL (ref 79–97)
Platelets: 277 10*3/uL (ref 150–450)
RBC: 4.83 x10E6/uL (ref 3.77–5.28)
RDW: 13.9 % (ref 11.7–15.4)
WBC: 6.2 10*3/uL (ref 3.4–10.8)

## 2022-06-02 LAB — LIPID PANEL
Chol/HDL Ratio: 3.1 ratio (ref 0.0–4.4)
Cholesterol, Total: 194 mg/dL (ref 100–199)
HDL: 62 mg/dL (ref >39)
LDL Chol Calc (NIH): 121 mg/dL — ABNORMAL HIGH (ref 0–99)
Triglycerides: 57 mg/dL (ref 0–149)
VLDL Cholesterol Cal: 11 mg/dL (ref 5–40)

## 2022-06-02 LAB — COMPREHENSIVE METABOLIC PANEL
ALT: 15 IU/L (ref 0–32)
AST: 24 IU/L (ref 0–40)
Albumin/Globulin Ratio: 1.9 (ref 1.2–2.2)
Albumin: 4.7 g/dL (ref 3.9–4.9)
Alkaline Phosphatase: 75 IU/L (ref 44–121)
BUN/Creatinine Ratio: 18 (ref 12–28)
BUN: 14 mg/dL (ref 8–27)
Bilirubin Total: 0.5 mg/dL (ref 0.0–1.2)
CO2: 28 mmol/L (ref 20–29)
Calcium: 9.6 mg/dL (ref 8.7–10.3)
Chloride: 102 mmol/L (ref 96–106)
Creatinine, Ser: 0.79 mg/dL (ref 0.57–1.00)
Globulin, Total: 2.5 g/dL (ref 1.5–4.5)
Glucose: 86 mg/dL (ref 70–99)
Potassium: 4.3 mmol/L (ref 3.5–5.2)
Sodium: 140 mmol/L (ref 134–144)
Total Protein: 7.2 g/dL (ref 6.0–8.5)
eGFR: 85 mL/min/{1.73_m2} (ref >59)

## 2022-06-02 LAB — TSH: TSH: 2.56 u[IU]/mL (ref 0.450–4.500)

## 2022-06-02 LAB — MAGNESIUM: Magnesium: 2.1 mg/dL (ref 1.6–2.3)

## 2022-06-07 ENCOUNTER — Other Ambulatory Visit: Payer: Self-pay

## 2022-06-07 DIAGNOSIS — E7800 Pure hypercholesterolemia, unspecified: Secondary | ICD-10-CM

## 2022-06-07 NOTE — Progress Notes (Signed)
Per Melina Copa, PA-C pt should have fasting lipids in 4-6 months.

## 2022-07-04 ENCOUNTER — Encounter: Payer: Self-pay | Admitting: Cardiology

## 2022-07-04 ENCOUNTER — Ambulatory Visit: Payer: BC Managed Care – PPO | Attending: Cardiology | Admitting: Cardiology

## 2022-07-04 VITALS — BP 110/68 | HR 77 | Ht 66.0 in | Wt 133.6 lb

## 2022-07-04 DIAGNOSIS — I480 Paroxysmal atrial fibrillation: Secondary | ICD-10-CM

## 2022-07-04 DIAGNOSIS — I491 Atrial premature depolarization: Secondary | ICD-10-CM | POA: Insufficient documentation

## 2022-07-04 DIAGNOSIS — I499 Cardiac arrhythmia, unspecified: Secondary | ICD-10-CM | POA: Insufficient documentation

## 2022-07-04 DIAGNOSIS — R031 Nonspecific low blood-pressure reading: Secondary | ICD-10-CM | POA: Insufficient documentation

## 2022-07-04 MED ORDER — DILTIAZEM HCL ER COATED BEADS 120 MG PO CP24: 120.0000 mg | ORAL_CAPSULE | Freq: Every day | ORAL | 3 refills | Status: DC

## 2022-07-04 NOTE — Progress Notes (Signed)
Electrophysiology Office Note   Date:  07/04/2022   ID:  Shelly Mahoney, DOB 05/22/1961, MRN [id number]  PCP:  Lujean Amel, MD  Cardiologist:  Gershon Cull Primary Electrophysiologist:  Constance Haw, MD    Chief Complaint: AF, PACs   History of Present Illness: Shelly Mahoney is a 61 y.o. female who is being seen today for the evaluation of AF, PACs at the request of Dunn, Dayna N, PA-C. Presenting today for electrophysiology evaluation.  She has a history significant for PACs, atrial fibrillation.  She was diagnosed with atrial fibrillation in 2021.  She has had multiple ER visits due to atrial fibrillation.  More recently, she feels that she has had up to 6 episodes of short-lived palpitations.  They last between 2 to 8 hours.  These are associated with lying on her left side, strenuous exertion, excess caffeine, large meals.  She feels diltiazem has been good at controlling her symptoms.  She has a multitude of symptoms related to atrial fibrillation.  Her episodes last a few hours as above.  She does feel like the diltiazem has made a difference.  Today, she denies symptoms of palpitations, chest pain, shortness of breath, orthopnea, PND, lower extremity edema, claudication, dizziness, presyncope, syncope, bleeding, or neurologic sequela. The patient is tolerating medications without difficulties.    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      diltiazem (CARDIZEM CD) 120 MG 24 hr capsule Take 1 capsule (120 mg total) by mouth daily. 60 capsule 3   diltiazem (CARDIZEM) 30 MG tablet Take 1 tablet every 4 hours AS NEEDED for heart rate >100 as long as top blood pressure >100. 30 tablet 1   EPIPEN 2-PAK 0.3 MG/0.3ML SOAJ injection Inject 0.3 mg into the muscle as needed for anaphylaxis.     No current facility-administered medications for this visit.    Allergies:   Cephalosporins, Other, and Penicillins   Social History:  The patient  reports that she has never smoked. She has never used smokeless tobacco. She reports current alcohol use of about 1.0 standard drink of alcohol per week. She reports that she does not use drugs.   Family History:  The patient's family history includes Arthritis in her mother; Colon polyps (age of onset: 16) in her father; Hypertension in her mother; Osteoporosis in her mother; Other in her father and mother; Stroke in her mother.    ROS:  Please see the history of present illness.   Otherwise, review of systems is positive for none.   All other systems are reviewed and negative.    PHYSICAL EXAM: VS:  BP 110/68   Pulse 77   Ht '5\' 6"'$  (1.676 m)   Wt 133 lb 9.6 oz (60.6 kg)   SpO2 99%  BMI 21.56 kg/m  , BMI Body mass index is 21.56 kg/m. GEN: Well nourished, well developed, in no acute distress  HEENT: normal  Neck: no JVD, carotid bruits, or masses Cardiac: RRR; no murmurs, rubs, or gallops,no edema  Respiratory:  clear to auscultation bilaterally, normal work of breathing GI: soft, nontender, nondistended, + BS MS: no deformity or atrophy  Skin: warm and dry Neuro:  Strength and sensation are intact Psych: euthymic mood, full affect  EKG:  EKG is not ordered today. Personal review of the ekg ordered 05/30/22 shows sinus rhythm, rate 64  Recent Labs: 06/01/2022: ALT 15; BUN 14; Creatinine, Ser 0.79; Hemoglobin 14.0; Magnesium 2.1; Platelets 277; Potassium 4.3;  Sodium 140; TSH 2.560    Lipid Panel     Component Value Date/Time   CHOL 194 06/01/2022 0757   TRIG 57 06/01/2022 0757   HDL 62 06/01/2022 0757   CHOLHDL 3.1 06/01/2022 0757   LDLCALC 121 (H) 06/01/2022 0757     Wt Readings from Last 3 Encounters:  07/04/22 133 lb 9.6 oz (60.6 kg)  05/30/22 134 lb (60.8 kg)  02/09/21 135 lb (61.2 kg)      Other studies Reviewed: Additional studies/ records that were reviewed today include: TTE 08/02/21  Review of the above records today demonstrates:   1. Aortic dilatation noted. There is mild dilatation of the ascending  aorta, measuring 38 mm and based on age, gender, and body surface area.   2. Left ventricular ejection fraction, by estimation, is 60 to 65%. Left  ventricular ejection fraction by 3D volume is 60 %. The left ventricle has  normal function. The left ventricle has no regional wall motion  abnormalities. Left ventricular diastolic   parameters were normal.   3. The mitral valve is normal in structure. Trivial mitral valve  regurgitation. No evidence of mitral stenosis.   4. The aortic valve is tricuspid. Aortic valve regurgitation is not  visualized. No aortic stenosis is present.   5. Right ventricular systolic function is normal. The right ventricular  size is normal. Tricuspid regurgitation signal is inadequate for assessing  PA pressure.    ASSESSMENT AND PLAN:  1.  Paroxysmal atrial fibrillation: CHA2DS2-VASc 1.  Currently not anticoagulated.  Takes diltiazem 30 mg as needed.  She is continue to have episodes of atrial fibrillation.  She feels these are related to exercise or laying on her left side.  She would like to start an exercise regimen.  As diltiazem has helped her in the past, Shelly Mahoney start 120 mg daily.  2.  Hypotension: Has baseline low blood pressures.  Does not limit her ability to manage AF.  No changes.  3.  Aortic dilation: Plan per primary cardiology  Current medicines are reviewed at length with the  patient today.   The patient does not have concerns regarding her medicines.  The following changes were made today: Start diltiazem  Labs/ tests ordered today include:  No orders of the defined types were placed in this encounter.    Disposition:   FU 3 months  Signed, Shelly Seiler Meredith Leeds, MD  07/04/2022 11:24 AM     [facility] [address] [place] [address] [phone] (office) [phone] (fax)

## 2022-07-04 NOTE — Patient Instructions (Addendum)
Medication Instructions:  Your physician has recommended you make the following change in your medication:  START Diltiazem 120 mg once daily  *If you need a refill on your cardiac medications before your next appointment, please call your pharmacy*   Lab Work: None ordered   Testing/Procedures: None ordered   Follow-Up: At [facility], you and your health needs are our priority.  As part of our continuing mission to provide you with exceptional heart care, we have created designated Provider Care Teams.  These Care Teams include your primary Cardiologist (physician) and Advanced Practice Providers (APPs -  Physician Assistants and Nurse Practitioners) who all work together to provide you with the care you need, when you need it.  Your next appointment:   3 month(s)  The format for your next appointment:   In Person  Provider:   Tommye Standard, PA-C    Thank you for choosing [facility]!!   Trinidad Curet, RN [phone]  Other Instructions  Diltiazem Extended-Release Capsules or Tablets What is this medication? DILTIAZEM (dil TYE a zem) treats high blood pressure and prevents chest pain (angina). It works by relaxing the blood vessels, which helps decrease the amount of work your heart has to do. It belongs to a group of medications called calcium channel blockers. This medicine may be used for other purposes; ask your health care provider or pharmacist if you have questions. COMMON BRAND NAME(S): Cardizem CD, Cardizem LA, Cardizem SR, Cartia XT, Dilacor XR, Dilt-CD, Diltia XT, Diltzac, Matzim LA, Rema Fendt, TIADYLT ER, Tiamate, Tiazac What should I tell my care team before I take this medication? They need to know if you have any of these conditions: Heart attack Heart disease Irregular heartbeat or rhythm Low blood pressure An unusual or allergic reaction to diltiazem, other medications, foods, dyes, or preservatives Pregnant or trying to get  pregnant Breast-feeding How should I use this medication? Take this medication by mouth. Take it as directed on the prescription label at the same time every day. Do not cut, crush or chew this medication. Swallow the capsules whole. You can take it with or without food. If it upsets your stomach, take it with food. Keep taking it unless your care team tells you to stop. Talk to your care team about the use of this medication in children. Special care may be needed. Overdosage: If you think you have taken too much of this medicine contact a poison control center or emergency room at once. NOTE: This medicine is only for you. Do not share this medicine with others. What if I miss a dose? If you miss a dose, take it as soon as you can. If it is almost time for your next dose, take only that dose. Do not take double or extra doses. What may interact with this medication? Do not take this medication with any of the following: Cisapride [place] Pimozide Ranolazine Red yeast rice This medication may also interact with the following: Buspirone Carbamazepine Cimetidine Cyclosporine Digoxin Local anesthetics or general anesthetics Lovastatin Medications for anxiety or difficulty sleeping like midazolam and triazolam Medications for high blood pressure or heart problems Quinidine Rifampin, rifabutin, or rifapentine This list may not describe all possible interactions. Give your health care provider a list of all the medicines, herbs, non-prescription drugs, or dietary supplements you use. Also tell them if you smoke, drink alcohol, or use illegal drugs. Some items may interact with your medicine. What should I watch for while using this medication? Visit your  care team for regular checks on your progress. Check your blood pressure as directed. Ask your care team what your blood pressure should be. Do not treat yourself for coughs, colds, or pain while you are using this medication without  asking your care team for advice. Some medications may increase your blood pressure. This medication may cause serious skin reactions. They can happen weeks to months after starting the medication. Contact your care team right away if you notice fevers or flu-like symptoms with a rash. The rash may be red or purple and then turn into blisters or peeling of the skin. Or, you might notice a red rash with swelling of the face, lips or lymph nodes in your neck or under your arms. You may get drowsy or dizzy. Do not drive, use machinery, or do anything that needs mental alertness until you know how this medication affects you. Do not stand up or sit up quickly, especially if you are an older patient. This reduces the risk of dizzy or fainting spells. What side effects may I notice from receiving this medication? Side effects that you should report to your care team as soon as possible: Allergic reactions--skin rash, itching, hives, swelling of the face, lips, tongue, or throat Heart failure--shortness of breath, swelling of the ankles, feet, or hands, sudden weight gain, unusual weakness or fatigue Slow heartbeat--dizziness, feeling faint or lightheaded, trouble breathing, unusual weakness or fatigue Liver injury--right upper belly pain, loss of appetite, nausea, light-colored stool, dark yellow or brown urine, yellowing skin or eyes, unusual weakness or fatigue Low blood pressure--dizziness, feeling faint or lightheaded, blurry vision Redness, blistering, peeling, or loosening of the skin, including inside the mouth Side effects that usually do not require medical attention (report to your care team if they continue or are bothersome): Constipation Facial flushing, redness Headache This list may not describe all possible side effects. Call your doctor for medical advice about side effects. You may report side effects to FDA at 1-800-FDA-1088. Where should I keep my medication? Keep out of the reach of  children and pets. Store at room temperature between 20 and 25 degrees C (68 and 77 degrees F). Protect from moisture. Keep the container tightly closed. Throw away any unused medication after the expiration date. NOTE: This sheet is a summary. It may not cover all possible information. If you have questions about this medicine, talk to your doctor, pharmacist, or health care provider.  2023 Elsevier/Gold Standard (2007-09-01 00:00:00)   Important Information About Sugar

## 2022-10-05 ENCOUNTER — Ambulatory Visit: Payer: BC Managed Care – PPO | Admitting: Physician Assistant

## 2022-10-10 ENCOUNTER — Other Ambulatory Visit: Payer: BC Managed Care – PPO

## 2022-11-23 ENCOUNTER — Ambulatory Visit: Payer: BC Managed Care – PPO

## 2023-01-09 ENCOUNTER — Ambulatory Visit: Payer: BC Managed Care – PPO | Attending: Physician Assistant

## 2023-01-16 ENCOUNTER — Ambulatory Visit: Payer: BC Managed Care – PPO | Admitting: Cardiology

## 2023-03-01 ENCOUNTER — Other Ambulatory Visit: Payer: Self-pay | Admitting: Cardiology

## 2023-05-01 ENCOUNTER — Ambulatory Visit: Payer: BC Managed Care – PPO | Admitting: Cardiology

## 2023-08-01 ENCOUNTER — Other Ambulatory Visit (HOSPITAL_BASED_OUTPATIENT_CLINIC_OR_DEPARTMENT_OTHER): Payer: Self-pay

## 2023-08-01 MED ORDER — FLULAVAL 0.5 ML IM SUSY
PREFILLED_SYRINGE | INTRAMUSCULAR | 0 refills | Status: AC
  Filled 2023-08-01: qty 0.5, 1d supply, fill #0

## 2023-08-25 ENCOUNTER — Other Ambulatory Visit: Payer: Self-pay | Admitting: Cardiology

## 2023-09-15 ENCOUNTER — Ambulatory Visit: Payer: BC Managed Care – PPO | Admitting: Cardiology

## 2023-09-25 ENCOUNTER — Other Ambulatory Visit: Payer: Self-pay | Admitting: Cardiology

## 2023-10-18 ENCOUNTER — Ambulatory Visit: Payer: Self-pay | Admitting: Cardiology

## 2023-11-19 NOTE — Progress Notes (Unsigned)
 This encounter was created in error - please disregard.

## 2023-11-20 ENCOUNTER — Ambulatory Visit: Payer: Self-pay | Admitting: Cardiology

## 2023-12-04 ENCOUNTER — Ambulatory Visit: Admitting: Cardiology

## 2024-02-07 ENCOUNTER — Ambulatory Visit: Admitting: Cardiology

## 2024-02-13 ENCOUNTER — Institutional Professional Consult (permissible substitution) (INDEPENDENT_AMBULATORY_CARE_PROVIDER_SITE_OTHER): Admitting: Otolaryngology

## 2024-02-13 ENCOUNTER — Ambulatory Visit (INDEPENDENT_AMBULATORY_CARE_PROVIDER_SITE_OTHER): Admitting: Audiology

## 2024-04-22 ENCOUNTER — Ambulatory Visit (INDEPENDENT_AMBULATORY_CARE_PROVIDER_SITE_OTHER): Admitting: Audiology

## 2024-04-22 ENCOUNTER — Institutional Professional Consult (permissible substitution) (INDEPENDENT_AMBULATORY_CARE_PROVIDER_SITE_OTHER): Admitting: Otolaryngology

## 2024-04-24 ENCOUNTER — Ambulatory Visit: Payer: Self-pay | Admitting: Cardiology

## 2024-05-16 ENCOUNTER — Ambulatory Visit: Payer: Self-pay | Admitting: Cardiology

## 2024-06-10 ENCOUNTER — Other Ambulatory Visit (HOSPITAL_BASED_OUTPATIENT_CLINIC_OR_DEPARTMENT_OTHER): Payer: Self-pay

## 2024-06-10 MED ORDER — FLUZONE 0.5 ML IM SUSY
0.5000 mL | PREFILLED_SYRINGE | Freq: Once | INTRAMUSCULAR | 0 refills | Status: AC
  Filled 2024-06-10: qty 0.5, 1d supply, fill #0

## 2024-06-17 ENCOUNTER — Ambulatory Visit (INDEPENDENT_AMBULATORY_CARE_PROVIDER_SITE_OTHER): Admitting: Otolaryngology

## 2024-06-17 ENCOUNTER — Encounter (INDEPENDENT_AMBULATORY_CARE_PROVIDER_SITE_OTHER): Payer: Self-pay | Admitting: Otolaryngology

## 2024-06-17 VITALS — BP 96/69 | HR 80 | Temp 97.5°F | Ht 65.0 in | Wt 130.0 lb

## 2024-06-17 DIAGNOSIS — H9313 Tinnitus, bilateral: Secondary | ICD-10-CM | POA: Diagnosis not present

## 2024-06-17 NOTE — Progress Notes (Signed)
 CC: Bilateral tinnitus  Discussed the use of AI scribe software for clinical note transcription with the patient, who gave verbal consent to proceed.  History of Present Illness Shelly Mahoney is a 63 year old female who presents with persistent tinnitus.  She began experiencing tinnitus around January or February of this year. Initially, the episodes were short and occurred once a week, but the ringing has since become constant. The sensation is described as a high-pitched ringing in the middle of her head, without favoring either ear.  No recent exposure to loud noises, although she recalls attending a loud concert nine years ago, which temporarily affected her hearing. 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  Her current medication includes Cardizem , which she takes as needed, approximately once or twice a month at a dose of 30 mg. She does not take any other medications.  She has not had a recent hearing test due to scheduling issues. Her father has significant hearing loss and uses a hearing aid.   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 PYLORI   Colon cancer Neg Hx    CAD Neg Hx    Liver cancer Neg Hx    Kidney cancer Neg Hx     Social History:  reports that she has never smoked. She has never used  smokeless tobacco. She reports current alcohol use of about 1.0 standard drink of alcohol per week. She reports that she does not use drugs.  Allergies:  Allergies  Allergen Reactions   Cephalosporins Hives   Other Swelling    Bees , metal (nickel): causes swelling Red skinned fruit    Penicillins Hives and Rash    Did it involve swelling of the face/tongue/throat, SOB, or low BP? no Did it involve sudden or severe rash/hives, skin peeling, or any reaction on the inside of your mouth or nose? yes Did you need to seek medical attention at a hospital or doctor's office? no When did it last happen?  40 years     If all above answers are "NO", may proceed with cephalosporin use.    Prior to Admission medications   Medication Sig Start Date End Date Taking? Authorizing Provider  Cholecalciferol (VITAMIN D-3) 1000 UNITS CAPS Take 1,000 Units by mouth daily.    Yes [provider]  diltiazem  (CARDIZEM  CD) 120 MG 24 hr capsule TAKE 1 CAPSULE BY MOUTH EVERY DAY 09/27/23  Yes Camnitz, Will Gladis, MD  diltiazem  (CARDIZEM ) 30 MG tablet Take 1 tablet every 4 hours AS NEEDED  for heart rate >100 as long as top blood pressure >100. 03/22/22  Yes Fenton, Clint R, PA  EPIPEN 2-PAK 0.3 MG/0.3ML SOAJ injection Inject 0.3 mg into the muscle as needed for anaphylaxis. 03/05/20  Yes [provider]  influenza vac split trivalent PF (FLULAVAL ) 0.5 ML injection Inject into the muscle. Patient not taking: Reported on 06/17/2024 08/01/23   Luiz Channel, MD    Blood pressure 96/69, pulse 80, temperature (!) 97.5 F (36.4 C), temperature source Oral, height 5' 5 (1.651 m), weight 130 lb (59 kg), SpO2 95%. Exam: General: Communicates without difficulty, well nourished, no acute distress. Head: Normocephalic, no evidence injury, no tenderness, facial buttresses intact without stepoff. Face/sinus: No tenderness to palpation and percussion. Facial movement is normal and symmetric. Eyes: PERRL, EOMI. No  scleral icterus, conjunctivae clear. Neuro: CN II exam reveals vision grossly intact.  No nystagmus at any point of gaze. Ears: Auricles well formed without lesions.  Ear canals are intact without mass or lesion.  No erythema or edema is appreciated.  The TMs are intact without fluid. Nose: External evaluation reveals normal support and skin without lesions.  Dorsum is intact.  Anterior rhinoscopy reveals congested mucosa over anterior aspect of inferior turbinates and intact septum.  No purulence noted. Oral:  Oral cavity and oropharynx are intact, symmetric, without erythema or edema.  Mucosa is moist without lesions. Neck: Full range of motion without pain.  There is no significant lymphadenopathy.  No masses palpable.  Thyroid  bed within normal limits to palpation.  Parotid glands and submandibular glands equal bilaterally without mass.  Trachea is midline. Neuro:  CN 2-12 grossly intact.   Assessment and Plan Assessment & Plan Tinnitus with suspected hearing loss Chronic high-pitched tinnitus since January or February, initially intermittent but now persistent.  No significant occupational noise exposure or ototoxic medication use. Physical examination of ears is normal. Tinnitus likely related to hearing loss. - Scheduled hearing test to assess degree of hearing loss.  No audiologist is available today. - The strategies to cope with tinnitus, including the use of masker, hearing aids, tinnitus retraining therapy, and avoidance of caffeine and alcohol are discussed.  - If hearing test shows significant asymmetry, will consider MRI to rule out retrocochlear lesion. - Will follow up in one year unless hearing test indicates need for earlier evaluation.  Jadin Creque W Dereka Lueras 06/17/2024, 2:27 PM

## 2024-06-24 ENCOUNTER — Institutional Professional Consult (permissible substitution) (INDEPENDENT_AMBULATORY_CARE_PROVIDER_SITE_OTHER): Admitting: Otolaryngology

## 2024-06-24 ENCOUNTER — Ambulatory Visit (INDEPENDENT_AMBULATORY_CARE_PROVIDER_SITE_OTHER): Admitting: Audiology

## 2024-06-26 ENCOUNTER — Ambulatory Visit: Admitting: Cardiology

## 2024-08-07 ENCOUNTER — Encounter: Payer: Self-pay | Admitting: Cardiology

## 2024-08-07 ENCOUNTER — Ambulatory Visit: Attending: Cardiology | Admitting: Cardiology

## 2024-08-07 VITALS — BP 128/85 | HR 65 | Ht 65.0 in | Wt 133.4 lb

## 2024-08-07 DIAGNOSIS — I480 Paroxysmal atrial fibrillation: Secondary | ICD-10-CM | POA: Diagnosis not present

## 2024-08-07 DIAGNOSIS — I491 Atrial premature depolarization: Secondary | ICD-10-CM

## 2024-08-07 DIAGNOSIS — I77810 Thoracic aortic ectasia: Secondary | ICD-10-CM | POA: Diagnosis not present

## 2024-08-07 DIAGNOSIS — I484 Atypical atrial flutter: Secondary | ICD-10-CM | POA: Diagnosis not present

## 2024-08-07 NOTE — Progress Notes (Signed)
 " Electrophysiology Office Note:   Date:  08/08/2024  ID:  Shelly Mahoney, DOB Jun 24, 1961, MRN [id number]  Primary Cardiologist: Candyce Reek, MD Primary Heart Failure: None Electrophysiologist: Priscilla Kirstein Gladis Norton, MD      History of Present Illness:   Shelly Mahoney is a 64 y.o. female with h/o atrial fibrillation, PACs, hypotension, aortic dilation seen today for routine electrophysiology followup.   Discussed the use of AI scribe software for clinical note transcription with the patient, who gave verbal consent to proceed.  History of Present Illness Shelly Mahoney is a 64 year old female with atrial tachycardia who presents with concerns about her heart rhythm and medication management.  She has been experiencing episodes of rapid heart rate and irregular heartbeat, described as 'attacks', for the past two years. Initially, her heart rate would reach up to 199 beats per minute, and her blood pressure would rise to 185 mmHg systolic. Over time, these episodes have become milder, with her heart rate now peaking at 110 beats per minute and blood pressure at 140/90 mmHg. She attributes some improvement to increased water intake and dietary changes, including more potassium and iron-rich foods.  She manages her symptoms with diltiazem  30 mg instant release, taken as needed, which resolves her symptoms within two to four hours. Recently, she has managed milder episodes by lying down and reducing stress without medication. Stress is identified as a trigger, with the last significant episode occurring on December 25th during the holidays.  During episodes, she experiences a 'burning sensation' in her chest, particularly with exertion, such as climbing stairs. This is accompanied by dizziness if exertion continues, but resolves upon resting. She is concerned these symptoms might indicate a heart attack.  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 She is unsure if  this condition has progressed, as she has not had recent follow-up imaging. There is a family history of supraventricular tachycardia, with her father also having the condition.  Her current medication regimen includes diltiazem  30 mg as needed. She previously tried a 120 mg extended-release formulation but discontinued it due to adverse effects. She is interested in exploring other medication options to manage her condition.  she denies chest pain, palpitations, dyspnea, PND, orthopnea, nausea, vomiting, dizziness, syncope, edema, weight gain, or early satiety.   Review of systems complete and found to be negative unless listed in HPI.   EP Information / Studies Reviewed:    EKG is ordered today. Personal review as below.  EKG Interpretation Date/Time:  Wednesday August 07 2024 16:34:17 EST Ventricular Rate:  65 PR Interval:  154 QRS Duration:  74 QT Interval:  404 QTC Calculation: 420 R Axis:   55  Text Interpretation: Normal sinus rhythm Normal ECG When compared with ECG of 16-Mar-2022 16:53, No significant change since last tracing Confirmed by Cailean Heacock ([id number]) on 08/07/2024 4:44:58 PM     Physical Exam:   VS:  BP 128/85 (BP Location: Left Arm, Patient Position: Sitting, Cuff Size: Normal)   Pulse 65   Ht 5' 5 (1.651 m)   Wt 133 lb 6.4 oz (60.5 kg)   SpO2 100%   BMI 22.20 kg/m    Wt Readings from Last 3 Encounters:  08/07/24 133 lb 6.4 oz (60.5 kg)  06/17/24 130 lb (59 kg)  07/04/22 133 lb 9.6 oz (60.6 kg)     GEN: Well nourished, well developed in no acute distress NECK: No JVD; No carotid bruits CARDIAC: Regular rate and  rhythm, no murmurs, rubs, gallops RESPIRATORY:  Clear to auscultation without rales, wheezing or rhonchi  ABDOMEN: Soft, non-tender, non-distended EXTREMITIES:  No edema; No deformity   ASSESSMENT AND PLAN:    1.  SVT: In review of her EKGs, I feel that she is having more atrial tachycardia and not atrial fibrillation.  She may wish to  start bisoprolol.  She Randol Zumstein research this medication.  If she wishes to start it, would start 2.5 mg daily.  2.  Hypotension: Has baseline blood pressures that are low.  Does not limit ability to manage A-fib.  No changes.  3.  Aortic dilation: Saman Giddens repeat echo to evaluate for aortic dilation/aneurysm  Follow up with EP Team in 6 months  Signed, Ishmel Acevedo Gladis Norton, MD  "

## 2024-08-07 NOTE — Patient Instructions (Signed)
 Medication Instructions:  Your physician recommends that you continue on your current medications as directed. Please refer to the Current Medication list given to you today.  *If you need a refill on your cardiac medications before your next appointment, please call your pharmacy*  Lab Work: None ordered   Testing/Procedures: Your physician has requested that you have an echocardiogram. Echocardiography is a painless test that uses sound waves to create images of your heart. It provides your doctor with information about the size and shape of your heart and how well your hearts chambers and valves are working. This procedure takes approximately one hour. There are no restrictions for this procedure. Please do NOT wear cologne, perfume, aftershave, or lotions (deodorant is allowed). Please arrive 15 minutes prior to your appointment time.  Please note: We ask at that you not bring children with you during ultrasound (echo/ vascular) testing. Due to room size and safety concerns, children are not allowed in the ultrasound rooms during exams. Our front office staff cannot provide observation of children in our lobby area while testing is being conducted. An adult accompanying a patient to their appointment will only be allowed in the ultrasound room at the discretion of the ultrasound technician under special circumstances. We apologize for any inconvenience.   Follow-Up: At [facility], you and your health needs are our priority.  As part of our continuing mission to provide you with exceptional heart care, our providers are all part of one team.  This team includes your primary Cardiologist (physician) and Advanced Practice Providers or APPs (Physician Assistants and Nurse Practitioners) who all work together to provide you with the care you need, when you need it.  Your next appointment:   6 month(s)  Provider:   You will see one of the following Advanced Practice Providers on  your designated Care Team:   Charlies Arthur, PA-C Michael Andy Tillery, PA-C Suzann Riddle, NP Daphne Barrack, NP Artist Pouch, PA-C    Thank you for choosing [facility]!!   Maeola Domino, RN [phone]

## 2024-08-08 ENCOUNTER — Other Ambulatory Visit (HOSPITAL_BASED_OUTPATIENT_CLINIC_OR_DEPARTMENT_OTHER): Payer: Self-pay

## 2024-08-08 ENCOUNTER — Encounter: Payer: Self-pay | Admitting: Cardiology

## 2024-08-08 MED ORDER — DILTIAZEM HCL 30 MG PO TABS
ORAL_TABLET | ORAL | 3 refills | Status: AC
  Filled 2024-08-08: qty 30, 5d supply, fill #0

## 2024-08-08 NOTE — Addendum Note (Signed)
 Addended by: GRETEL MAEOLA CROME on: 08/08/2024 12:22 PM   Modules accepted: Orders

## 2024-09-10 ENCOUNTER — Ambulatory Visit (HOSPITAL_COMMUNITY)
